# Patient Record
Sex: Female | Born: 1960 | Race: White | Hispanic: No | Smoking: Former smoker
Health system: Southern US, Community
[De-identification: ages and names within clinical notes are randomized; demographics above are authoritative.]

## PROBLEM LIST (undated history)

## (undated) DIAGNOSIS — Z9889 Other specified postprocedural states: Secondary | ICD-10-CM

## (undated) DIAGNOSIS — R112 Nausea with vomiting, unspecified: Secondary | ICD-10-CM

## (undated) DIAGNOSIS — D126 Benign neoplasm of colon, unspecified: Secondary | ICD-10-CM

## (undated) DIAGNOSIS — T4145XA Adverse effect of unspecified anesthetic, initial encounter: Secondary | ICD-10-CM

## (undated) DIAGNOSIS — D509 Iron deficiency anemia, unspecified: Secondary | ICD-10-CM

## (undated) DIAGNOSIS — N950 Postmenopausal bleeding: Secondary | ICD-10-CM

## (undated) DIAGNOSIS — Z9289 Personal history of other medical treatment: Secondary | ICD-10-CM

## (undated) DIAGNOSIS — Z86010 Personal history of colonic polyps: Secondary | ICD-10-CM

## (undated) DIAGNOSIS — Z860101 Personal history of adenomatous and serrated colon polyps: Secondary | ICD-10-CM

## (undated) DIAGNOSIS — N9489 Other specified conditions associated with female genital organs and menstrual cycle: Secondary | ICD-10-CM

## (undated) DIAGNOSIS — Z86718 Personal history of other venous thrombosis and embolism: Secondary | ICD-10-CM

## (undated) DIAGNOSIS — E039 Hypothyroidism, unspecified: Secondary | ICD-10-CM

## (undated) HISTORY — DX: Iron deficiency anemia, unspecified: D50.9

## (undated) HISTORY — PX: INCISION AND DRAINAGE: SHX5863

## (undated) HISTORY — PX: COLONOSCOPY W/ POLYPECTOMY: SHX1380

## (undated) HISTORY — DX: Benign neoplasm of colon, unspecified: D12.6

---

## 1999-08-03 DIAGNOSIS — T8859XA Other complications of anesthesia, initial encounter: Secondary | ICD-10-CM

## 1999-08-03 HISTORY — PX: SHOULDER ARTHROSCOPY: SHX128

## 1999-08-03 HISTORY — DX: Other complications of anesthesia, initial encounter: T88.59XA

## 2002-08-02 HISTORY — PX: ROUX-EN-Y GASTRIC BYPASS: SHX1104

## 2006-09-29 ENCOUNTER — Encounter: Payer: Self-pay | Admitting: Family Medicine

## 2006-09-29 ENCOUNTER — Other Ambulatory Visit: Admission: RE | Admit: 2006-09-29 | Discharge: 2006-09-29 | Payer: Self-pay | Admitting: Family Medicine

## 2006-09-29 ENCOUNTER — Ambulatory Visit: Payer: Self-pay | Admitting: Family Medicine

## 2007-02-16 ENCOUNTER — Encounter: Payer: Self-pay | Admitting: Family Medicine

## 2007-02-16 ENCOUNTER — Telehealth: Payer: Self-pay | Admitting: Family Medicine

## 2007-03-08 ENCOUNTER — Telehealth: Payer: Self-pay | Admitting: Family Medicine

## 2008-07-30 ENCOUNTER — Ambulatory Visit: Admission: RE | Admit: 2008-07-30 | Discharge: 2008-07-30 | Payer: Self-pay | Admitting: Emergency Medicine

## 2008-07-30 ENCOUNTER — Encounter (INDEPENDENT_AMBULATORY_CARE_PROVIDER_SITE_OTHER): Payer: Self-pay | Admitting: Emergency Medicine

## 2008-07-30 ENCOUNTER — Ambulatory Visit: Payer: Self-pay | Admitting: Surgery

## 2010-02-04 ENCOUNTER — Emergency Department (HOSPITAL_COMMUNITY): Admission: EM | Admit: 2010-02-04 | Discharge: 2010-02-04 | Payer: Self-pay | Admitting: Emergency Medicine

## 2010-10-18 LAB — GLUCOSE, CAPILLARY
Glucose-Capillary: 123 mg/dL — ABNORMAL HIGH (ref 70–99)
Glucose-Capillary: 36 mg/dL — CL (ref 70–99)

## 2010-10-18 LAB — POCT I-STAT, CHEM 8
Hemoglobin: 15.6 g/dL — ABNORMAL HIGH (ref 12.0–15.0)
Sodium: 142 mEq/L (ref 135–145)
TCO2: 21 mmol/L (ref 0–100)

## 2011-09-30 ENCOUNTER — Emergency Department (HOSPITAL_COMMUNITY)
Admission: EM | Admit: 2011-09-30 | Discharge: 2011-09-30 | Disposition: A | Discharge status: Home / Self Care | Attending: Emergency Medicine | Admitting: Emergency Medicine

## 2011-09-30 DIAGNOSIS — R259 Unspecified abnormal involuntary movements: Secondary | ICD-10-CM | POA: Insufficient documentation

## 2011-09-30 DIAGNOSIS — E162 Hypoglycemia, unspecified: Secondary | ICD-10-CM | POA: Insufficient documentation

## 2011-09-30 LAB — GLUCOSE, CAPILLARY: Glucose-Capillary: 227 mg/dL — ABNORMAL HIGH (ref 70–99)

## 2011-09-30 MED ORDER — DEXTROSE 50 % IV SOLN
50.0000 mL | Freq: Once | INTRAVENOUS | Status: AC
  Administered 2011-09-30: 50 mL via INTRAVENOUS

## 2011-09-30 MED ORDER — SODIUM CHLORIDE 0.9 % IV BOLUS (SEPSIS)
500.0000 mL | INTRAVENOUS | Status: AC
  Administered 2011-09-30: 500 mL via INTRAVENOUS

## 2011-09-30 MED ORDER — ONDANSETRON 4 MG PO TBDP
ORAL_TABLET | ORAL | Status: AC
  Administered 2011-09-30: 4 mg
  Filled 2011-09-30: qty 1

## 2011-09-30 NOTE — ED Provider Notes (Signed)
History     CSN: [id number]  Arrival date & time 09/30/11  1478   First MD Initiated Contact with Patient 09/30/11 [phone]      Chief Complaint  Patient presents with  . Hypoglycemia    (Consider location/radiation/quality/duration/timing/severity/associated sxs/prior treatment) HPI Comments: Very Pleasant 51 year old female who presents with a complaint of low blood sugar. 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  The history is provided by the patient.    No past medical history on file.  No past surgical history on file.  No family history on file.  History  Substance Use Topics  . Smoking status: Not on file  . Smokeless tobacco: Not on file  . Alcohol Use: Not on file    OB History    No data available      Review of Systems  Gastrointestinal: Negative for nausea and vomiting.  Neurological: Positive for tremors.    Allergies  Codeine and Penicillins  Home Medications   Current Outpatient Rx  Name Route Sig Dispense Refill  . ESTRADIOL 2 MG PO TABS Oral Take 2 mg by mouth daily.    Marland Kitchen LEVOTHYROXINE SODIUM 150 MCG PO TABS Oral Take 150 mcg by mouth daily.      BP 102/62  Pulse 61  Resp 20  SpO2 98%  Physical Exam  Constitutional: She appears well-developed and well-nourished.  Cardiovascular: Normal rate, regular rhythm, normal heart sounds and intact distal pulses.   Pulmonary/Chest: Effort normal and breath sounds normal.  Neurological:       Neurologic exam essentially normal with no signs of weakness, numbness, normal speech, normal strength, moves all extremities  Skin: Skin is warm and dry. No rash noted. She is not diaphoretic.    ED Course  Procedures (including critical care time)  Labs Reviewed  GLUCOSE, CAPILLARY - Abnormal; Notable for the following:    Glucose-Capillary 227 (*)    All other components within normal limits   No results found.   1.  Hypoglycemia       MDM  Initial blood sugar at 56, patient able to tolerate by mouth fluids, D50 given, appropriate increase in blood sugar, patient's symptoms have resolved and she is amenable to discharge        Vida Roller, MD 09/30/11 [phone]

## 2011-09-30 NOTE — ED Notes (Signed)
Pt given discharge instructions and verb understanding, amb indep to discharge window 

## 2011-09-30 NOTE — ED Notes (Signed)
Pt in with near syncopal episode, noted to be diaphoretic, CBG 56

## 2011-09-30 NOTE — ED Notes (Signed)
Bed:WA09<BR> Expected date:<BR> Expected time:<BR> Means of arrival:<BR> Comments:<BR>

## 2011-10-01 ENCOUNTER — Encounter (HOSPITAL_COMMUNITY): Payer: Self-pay

## 2012-08-02 HISTORY — PX: COMBINED REDUCTION MAMMAPLASTY W/ ABDOMINOPLASTY: SUR306

## 2013-09-06 ENCOUNTER — Encounter (HOSPITAL_COMMUNITY): Payer: Self-pay | Admitting: Emergency Medicine

## 2013-09-06 ENCOUNTER — Emergency Department (HOSPITAL_COMMUNITY)
Admission: EM | Admit: 2013-09-06 | Discharge: 2013-09-06 | Disposition: A | Discharge status: Home / Self Care | Source: Home / Self Care | Attending: Family Medicine | Admitting: Family Medicine

## 2013-09-06 ENCOUNTER — Emergency Department (INDEPENDENT_AMBULATORY_CARE_PROVIDER_SITE_OTHER)

## 2013-09-06 DIAGNOSIS — S20229A Contusion of unspecified back wall of thorax, initial encounter: Secondary | ICD-10-CM

## 2013-09-06 HISTORY — DX: Hypothyroidism, unspecified: E03.9

## 2013-09-06 NOTE — ED Provider Notes (Signed)
CSN: [id number]     Arrival date & time 09/06/13  1701 History   First MD Initiated Contact with Patient 09/06/13 1720     Chief Complaint  Patient presents with  . Back Pain   (Consider location/radiation/quality/duration/timing/severity/associated sxs/prior Treatment) HPI  53 year old F with back pain.   Back Pain: Location: thoracic spine Duration: 1 week Quality: 3/10 rest, 8/10 with activity Current Functional Status:  ADL's intact, but cannot lie flat due to pain Preceding Events:6 mile hike with her [employer] training use while carry 35lb pack, no direct trauma to the back Alleviating Factors: ibuprofen Exacerbating Factors: lying flat, cross fit Hx of intervention: no surgery, no PT Hx of imaging: no Red Flags: no weakness, no numbness and tingling, no impaired bowel or bladder function   Past Medical History  Diagnosis Date  . Hypothyroidism    Past Surgical History  Procedure Laterality Date  . Gastric bypass     No family history on file. History  Substance Use Topics  . Smoking status: Never Smoker   . Smokeless tobacco: Not on file  . Alcohol Use: No   OB History   Grav Para Term Preterm Abortions TAB SAB Ect Mult Living                 Review of Systems  Allergies  Codeine and Penicillins  Home Medications   Current Outpatient Rx  Name  Route  Sig  Dispense  Refill  . estradiol (ESTRACE) 2 MG tablet   Oral   Take 2 mg by mouth daily.         Marland Kitchen levothyroxine (SYNTHROID, LEVOTHROID) 150 MCG tablet   Oral   Take 150 mcg by mouth daily.          BP 116/79  Pulse 60  Temp(Src) 98 F (36.7 C) (Oral)  Resp 20  SpO2 100%  LMP 08/22/2013 Physical Exam Gen: well appearing middle age F, non distressed Back:  Appearance: sciolosis no Palpation: tenderness of paraspinal muscles no, spinous process no; pelvis no  Flexion: mildly limited in thoracic and lumbar spine Extension: normal  Neuro: Strength hip flexion 5/5, hip abduction 5/5, hip  adduction 5/5,  knee extension 5/5, knee flexion 5/5, dorsiflexion 5/5, plantar flexion 5/5 bilaterally Reflexes: patella 2/2 Bilateral  Achilles 2/2 Bilateral Straight Leg Raise: negative Sensation to light touch intact: yes     ED Course  Procedures (including critical care time) Labs Review Labs Reviewed - No data to display Imaging Review Dg Thoracic Spine 2 View  09/06/2013   CLINICAL DATA:  Back pain.  EXAM: THORACIC SPINE - 2 VIEW  COMPARISON:  None.  FINDINGS: There is no fracture or malalignment. Mild anterior endplate spurring in the mid and lower thoracic spine is noted. Paraspinous structures demonstrate suture material in the left upper quadrant of the abdomen.  IMPRESSION: No acute finding.  Mild appearing degenerative change.   Electronically Signed   By: Inge Rise M.D.   On: 09/06/2013 18:17      MDM   1. Contusion of mid back    No fracture on X-ray. No neuro signs or symptoms. Will treat symptoms with NSAIDS and f/u with PCP as needed.     Angelica Ran, MD 09/06/13 [phone]

## 2013-09-06 NOTE — Discharge Instructions (Signed)
It was great to meet you. I am very glad that there is no evidence of fracture on the X-ray. Please use the NSAIDS as needed and good luck with your PT test this weekend.   Sincerely,   Dr. Maricela Bo

## 2013-09-06 NOTE — ED Notes (Signed)
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

## 2013-09-07 NOTE — ED Provider Notes (Signed)
Medical screening examination/treatment/procedure(s) were performed by a resident physician or non-physician practitioner and as the supervising physician I was immediately available for consultation/collaboration.  Everardo Voris, MD    Raykwon Hobbs S Evadene Wardrip, MD 09/07/13 1149 

## 2013-10-15 ENCOUNTER — Encounter: Payer: Self-pay | Admitting: Internal Medicine

## 2013-10-17 ENCOUNTER — Encounter

## 2013-10-25 ENCOUNTER — Encounter

## 2013-10-25 ENCOUNTER — Ambulatory Visit (AMBULATORY_SURGERY_CENTER): Payer: Self-pay | Admitting: *Deleted

## 2013-10-25 VITALS — Ht 66.0 in | Wt 147.8 lb

## 2013-10-25 DIAGNOSIS — Z1211 Encounter for screening for malignant neoplasm of colon: Secondary | ICD-10-CM

## 2013-10-25 MED ORDER — MOVIPREP 100 G PO SOLR: 1.0000 | Freq: Once | ORAL | Status: DC

## 2013-10-25 NOTE — Progress Notes (Signed)
No egg or soy allergy. No anesthesia problems.  

## 2013-10-29 ENCOUNTER — Encounter: Admitting: Internal Medicine

## 2013-10-31 ENCOUNTER — Ambulatory Visit (AMBULATORY_SURGERY_CENTER): Admitting: Internal Medicine

## 2013-10-31 ENCOUNTER — Other Ambulatory Visit: Payer: Self-pay | Admitting: Internal Medicine

## 2013-10-31 ENCOUNTER — Encounter: Payer: Self-pay | Admitting: Internal Medicine

## 2013-10-31 VITALS — BP 108/76 | HR 49 | Temp 98.3°F | Resp 16 | Ht 66.0 in | Wt 147.0 lb

## 2013-10-31 DIAGNOSIS — D126 Benign neoplasm of colon, unspecified: Secondary | ICD-10-CM

## 2013-10-31 DIAGNOSIS — Z1211 Encounter for screening for malignant neoplasm of colon: Secondary | ICD-10-CM

## 2013-10-31 MED ORDER — SODIUM CHLORIDE 0.9 % IV SOLN: 500.0000 mL | INTRAVENOUS | Status: DC

## 2013-10-31 NOTE — Progress Notes (Signed)
Report to pacu rn, vss, bbs=clear 

## 2013-10-31 NOTE — Progress Notes (Signed)
Called to room to assist during endoscopic procedure.  Patient ID and intended procedure confirmed with present staff. Received instructions for my participation in the procedure from the performing physician.  

## 2013-10-31 NOTE — Op Note (Signed)
[facility]  [address]. [place], [address]   COLONOSCOPY PROCEDURE REPORT  PATIENT: Alexis Arnold, Alexis Arnold  MR#: [id number] BIRTHDATE: [birth year] , 41  yrs. old GENDER: Female ENDOSCOPIST: Jerene Bears, MD REFERRED [id number] Philip Aspen, M.D. PROCEDURE DATE:  10/31/2013 PROCEDURE:   Colonoscopy with snare polypectomy First Screening Colonoscopy - Avg.  risk and is 50 yrs.  old or older Yes.  Prior Negative Screening - Now for repeat screening. N/A  History of Adenoma - Now for follow-up colonoscopy & has been > or = to 3 yrs.  N/A  Polyps Removed Today? Yes. ASA CLASS:   Class II INDICATIONS:average risk screening and first colonoscopy. MEDICATIONS: MAC sedation, administered by CRNA and Propofol (Diprivan) 380 mg IV  DESCRIPTION OF PROCEDURE:   After the risks benefits and alternatives of the procedure were thoroughly explained, informed consent was obtained.  A digital rectal exam revealed no rectal mass.   The LB [id number]  endoscope was introduced through the anus and advanced to the terminal ileum which was intubated for a short distance. No adverse events experienced.   The quality of the prep was good, using MoviPrep  The instrument was then slowly withdrawn as the colon was fully examined.   COLON FINDINGS: The mucosa appeared normal in the terminal ileum. A sessile polyp measuring 7-8 mm in size was found in the transverse colon.  A polypectomy was performed using snare cautery. The resection was complete and the polyp tissue was completely retrieved.   The colon mucosa was otherwise normal.  Retroflexed views revealed no abnormalities. The time to cecum=5 minutes 15 seconds.  Withdrawal time=13 minutes 49 seconds.  The scope was withdrawn and the procedure completed. COMPLICATIONS: There were no complications.  ENDOSCOPIC IMPRESSION: 1.   Normal mucosa in the terminal ileum 2.   Sessile polyp measuring 7-8 mm in size was found in the transverse  colon; polypectomy was performed using snare cautery 3.   The colon mucosa was otherwise normal  RECOMMENDATIONS: 1.  Hold aspirin, aspirin products, and anti-inflammatory medication for 2 weeks. 2.  Await pathology results 3.  Timing of repeat colonoscopy will be determined by pathology findings. 4.  You will receive a letter within 1-2 weeks with the results of your biopsy as well as final recommendations.  Please call my office if you have not received a letter after 3 weeks.   eSigned:  Jerene Bears, MD 10/31/2013 4:35 PM cc: The Patient and Leanna Battles, MD

## 2013-10-31 NOTE — Patient Instructions (Signed)

## 2013-11-01 ENCOUNTER — Telehealth: Payer: Self-pay | Admitting: *Deleted

## 2013-11-01 NOTE — Telephone Encounter (Signed)
Left message that we called for f/u 

## 2013-11-07 ENCOUNTER — Encounter (HOSPITAL_COMMUNITY): Payer: Self-pay

## 2013-11-07 ENCOUNTER — Encounter (HOSPITAL_COMMUNITY)
Admission: RE | Admit: 2013-11-07 | Discharge: 2013-11-07 | Disposition: A | Discharge status: Home / Self Care | Source: Ambulatory Visit | Attending: Internal Medicine | Admitting: Internal Medicine

## 2013-11-07 ENCOUNTER — Other Ambulatory Visit (HOSPITAL_COMMUNITY): Payer: Self-pay | Admitting: Internal Medicine

## 2013-11-07 DIAGNOSIS — D649 Anemia, unspecified: Secondary | ICD-10-CM | POA: Insufficient documentation

## 2013-11-07 MED ORDER — FERUMOXYTOL INJECTION 510 MG/17 ML
510.0000 mg | Freq: Once | INTRAVENOUS | Status: AC
  Administered 2013-11-07: 510 mg via INTRAVENOUS
  Filled 2013-11-07: qty 17

## 2013-11-07 MED ORDER — FERUMOXYTOL INJECTION 510 MG/17 ML: 510.0000 mg | Freq: Once | INTRAVENOUS | Status: DC

## 2013-11-07 MED ORDER — SODIUM CHLORIDE 0.9 % IV SOLN
Freq: Once | INTRAVENOUS | Status: AC
  Administered 2013-11-07: 14:00:00 via INTRAVENOUS

## 2013-11-07 NOTE — Discharge Instructions (Signed)

## 2013-11-11 ENCOUNTER — Encounter: Payer: Self-pay | Admitting: Internal Medicine

## 2013-11-14 ENCOUNTER — Encounter (HOSPITAL_COMMUNITY): Payer: Self-pay

## 2013-11-14 ENCOUNTER — Encounter (HOSPITAL_COMMUNITY)
Admission: RE | Admit: 2013-11-14 | Discharge: 2013-11-14 | Disposition: A | Discharge status: Home / Self Care | Source: Ambulatory Visit | Attending: Internal Medicine | Admitting: Internal Medicine

## 2013-11-14 MED ORDER — FERUMOXYTOL INJECTION 510 MG/17 ML
510.0000 mg | Freq: Once | INTRAVENOUS | Status: AC
  Administered 2013-11-14: 510 mg via INTRAVENOUS
  Filled 2013-11-14: qty 17

## 2013-11-14 MED ORDER — FERUMOXYTOL INJECTION 510 MG/17 ML: 510.0000 mg | Freq: Once | INTRAVENOUS | Status: DC

## 2013-11-14 MED ORDER — SODIUM CHLORIDE 0.9 % IV SOLN
Freq: Once | INTRAVENOUS | Status: AC
  Administered 2013-11-14: 14:00:00 via INTRAVENOUS

## 2013-11-14 NOTE — Discharge Instructions (Signed)

## 2014-03-07 ENCOUNTER — Inpatient Hospital Stay (HOSPITAL_COMMUNITY)
Admission: EM | Admit: 2014-03-07 | Discharge: 2014-03-09 | DRG: 989 | Disposition: A | Discharge status: Home / Self Care | Attending: Orthopaedic Surgery | Admitting: Orthopaedic Surgery

## 2014-03-07 ENCOUNTER — Encounter (HOSPITAL_COMMUNITY): Payer: Self-pay | Admitting: Emergency Medicine

## 2014-03-07 DIAGNOSIS — Z87891 Personal history of nicotine dependence: Secondary | ICD-10-CM

## 2014-03-07 DIAGNOSIS — M65849 Other synovitis and tenosynovitis, unspecified hand: Secondary | ICD-10-CM | POA: Diagnosis present

## 2014-03-07 DIAGNOSIS — Z86718 Personal history of other venous thrombosis and embolism: Secondary | ICD-10-CM

## 2014-03-07 DIAGNOSIS — E039 Hypothyroidism, unspecified: Secondary | ICD-10-CM | POA: Diagnosis present

## 2014-03-07 DIAGNOSIS — Z9884 Bariatric surgery status: Secondary | ICD-10-CM | POA: Diagnosis not present

## 2014-03-07 DIAGNOSIS — W5501XA Bitten by cat, initial encounter: Secondary | ICD-10-CM

## 2014-03-07 DIAGNOSIS — IMO0002 Reserved for concepts with insufficient information to code with codable children: Secondary | ICD-10-CM | POA: Diagnosis not present

## 2014-03-07 DIAGNOSIS — L03113 Cellulitis of right upper limb: Secondary | ICD-10-CM

## 2014-03-07 DIAGNOSIS — M65839 Other synovitis and tenosynovitis, unspecified forearm: Secondary | ICD-10-CM | POA: Diagnosis present

## 2014-03-07 DIAGNOSIS — IMO0001 Reserved for inherently not codable concepts without codable children: Secondary | ICD-10-CM | POA: Diagnosis present

## 2014-03-07 LAB — CBC WITH DIFFERENTIAL/PLATELET
Basophils Absolute: 0 10*3/uL (ref 0.0–0.1)
Basophils Relative: 0 % (ref 0–1)
EOS PCT: 1 % (ref 0–5)
Eosinophils Absolute: 0.1 10*3/uL (ref 0.0–0.7)
HCT: 40 % (ref 36.0–46.0)
HEMOGLOBIN: 13 g/dL (ref 12.0–15.0)
LYMPHS ABS: 2.4 10*3/uL (ref 0.7–4.0)
LYMPHS PCT: 20 % (ref 12–46)
MCH: 30.8 pg (ref 26.0–34.0)
MCHC: 32.5 g/dL (ref 30.0–36.0)
MCV: 94.8 fL (ref 78.0–100.0)
MONO ABS: 1 10*3/uL (ref 0.1–1.0)
MONOS PCT: 8 % (ref 3–12)
NEUTROS ABS: 8.5 10*3/uL — AB (ref 1.7–7.7)
Neutrophils Relative %: 71 % (ref 43–77)
Platelets: 333 10*3/uL (ref 150–400)
RBC: 4.22 MIL/uL (ref 3.87–5.11)
RDW: 13 % (ref 11.5–15.5)
WBC: 12 10*3/uL — AB (ref 4.0–10.5)

## 2014-03-07 LAB — BASIC METABOLIC PANEL
Anion gap: 9 (ref 5–15)
BUN: 16 mg/dL (ref 6–23)
CALCIUM: 9.1 mg/dL (ref 8.4–10.5)
CHLORIDE: 103 meq/L (ref 96–112)
CO2: 26 meq/L (ref 19–32)
CREATININE: 1.16 mg/dL — AB (ref 0.50–1.10)
GFR calc Af Amer: 61 mL/min — ABNORMAL LOW (ref >90)
GFR calc non Af Amer: 53 mL/min — ABNORMAL LOW (ref >90)
GLUCOSE: 89 mg/dL (ref 70–99)
Potassium: 4.1 mEq/L (ref 3.7–5.3)
Sodium: 138 mEq/L (ref 137–147)

## 2014-03-07 MED ORDER — SODIUM CHLORIDE 0.9 % IV BOLUS (SEPSIS)
1000.0000 mL | Freq: Once | INTRAVENOUS | Status: AC
  Administered 2014-03-07: 1000 mL via INTRAVENOUS

## 2014-03-07 MED ORDER — SODIUM CHLORIDE 0.9 % IV SOLN
3.0000 g | Freq: Once | INTRAVENOUS | Status: AC
  Administered 2014-03-07: 3 g via INTRAVENOUS
  Filled 2014-03-07: qty 3

## 2014-03-07 NOTE — ED Provider Notes (Signed)
CSN: [id number]     Arrival date & time 03/07/14  2138 History  This chart was scribed for non-physician practitioner, Margarita Mail, PA-C,working with Deliah Goody , by Marlowe Kays, ED Scribe. This patient was seen in room WTR7/WTR7 and the patient's care was started at 9:58 PM.  Chief Complaint  Patient presents with  . Animal Bite   The history is provided by the patient. No language interpreter was used.   HPI Comments:  Alexis Arnold is a 53 y.o. female who presents to the Emergency Department complaining of a cat bite to the dorsal aspect of her right arm that occurred five days ago. Pt states it was her house cat that bit her and the cat is UTD on all vaccinations. She reports being treated with Augmentin and has been taking as prescribed for the past four days. She states she cannot dorsiflex and has limited ROM of the wrist due to pain. She endorses taking Ibuprofen for the pain. She reports associated worsening redness, soreness, and warmth to touch. She denies any fever, chills, numbness or tingling of the arm. She states she is right-hand dominant. Pt's tetanus vaccination is UTD.  Past Medical History  Diagnosis Date  . Hypothyroidism   . Anemia    Past Surgical History  Procedure Laterality Date  . Gastric bypass    . Cesarean section  1990  . Shoulder arthroscopy Right 2001  . Roux-en-y procedure  2004  . Combined mammaplasty revision w/ abdominoplasty  2014   Family History  Problem Relation Age of Onset  . Colon cancer Neg Hx    History  Substance Use Topics  . Smoking status: Former Research scientist (life sciences)  . Smokeless tobacco: Never Used     Comment: smoked for 14 years  . Alcohol Use: Yes     Comment: occasionally   OB History   Grav Para Term Preterm Abortions TAB SAB Ect Mult Living                 Review of Systems  Constitutional: Negative for fever and chills.  Skin: Positive for color change and wound.  Neurological: Negative for numbness.  All other  systems reviewed and are negative.   Allergies  Codeine and Penicillins  Home Medications   Prior to Admission medications   Medication Sig Start Date End Date Taking? Authorizing Provider  amoxicillin-clavulanate (AUGMENTIN) 875-125 MG per tablet Take 1 tablet by mouth 2 (two) times daily.   Yes Historical Provider, MD  cyanocobalamin (,VITAMIN B-12,) 1000 MCG/ML injection Inject 1,000 mcg into the muscle every 30 (thirty) days.    Yes Historical Provider, MD  estrogens, conjugated, (PREMARIN) 1.25 MG tablet Take 1.25 mg by mouth daily.   Yes Historical Provider, MD  ibuprofen (ADVIL,MOTRIN) 200 MG tablet Take 800 mg by mouth every 6 (six) hours as needed for moderate pain.    Yes Historical Provider, MD  levothyroxine (SYNTHROID, LEVOTHROID) 150 MCG tablet Take 150 mcg by mouth daily.   Yes Historical Provider, MD  Multiple Vitamin (MULTIVITAMIN) tablet Take 1 tablet by mouth daily.   Yes Historical Provider, MD   Triage Vitals: BP 123/67  Pulse 86  Temp(Src) 98.1 F (36.7 C) (Oral)  Resp 16  SpO2 100%  LMP 03/05/2014 Physical Exam  Nursing note and vitals reviewed. Constitutional: She is oriented to person, place, and time. She appears well-developed and well-nourished. No distress.  HENT:  Head: Normocephalic and atraumatic.  Eyes: Conjunctivae and EOM are normal. No scleral icterus.  Neck:  Normal range of motion.  Cardiovascular: Normal rate, regular rhythm and normal heart sounds.  Exam reveals no gallop and no friction rub.   No murmur heard. Pulmonary/Chest: Effort normal and breath sounds normal. No respiratory distress.  Abdominal: Soft. Bowel sounds are normal. She exhibits no distension and no mass. There is no tenderness. There is no guarding.  Musculoskeletal: Normal range of motion.       Arms: 4x6 cm are of heat, redness, with scabbing, tight, shiny skin. TTP.  Able to make a fist. Unable to actively extend the the r forearm.  Neurological: She is alert and  oriented to person, place, and time.  Skin: Skin is warm and dry. She is not diaphoretic.  Psychiatric: She has a normal mood and affect. Her behavior is normal.    ED Course  Procedures (including critical care time) DIAGNOSTIC STUDIES: Oxygen Saturation is 100% on RA, normal by my interpretation.   COORDINATION OF CARE: 10:06 PM- Will administer IV antibiotics. Pt verbalizes understanding and agrees to plan.  Medications  Ampicillin-Sulbactam (UNASYN) 3 g in sodium chloride 0.9 % 100 mL IVPB (3 g Intravenous New Bag/Given 03/07/14 2224)  sodium chloride 0.9 % bolus 1,000 mL (1,000 mLs Intravenous New Bag/Given 03/07/14 2224)    Labs Review Labs Reviewed  CBC WITH DIFFERENTIAL - Abnormal; Notable for the following:    WBC 12.0 (*)    Neutro Abs 8.5 (*)    All other components within normal limits  BASIC METABOLIC PANEL - Abnormal; Notable for the following:    Creatinine, Ser 1.16 (*)    GFR calc non Af Amer 53 (*)    GFR calc Af Amer 61 (*)    All other components within normal limits    Imaging Review No results found.   EKG Interpretation None      MDM   Final diagnoses:  None    Patient with worsening cellulitis of the R forearm after cat bite and failure on OP augmentin.  I have ordered IV Unasyn. Dr. Ninfa Linden on call for hand will consult. Dr. Virgina Jock on call for [facility] medical will admit.  Patient seen in shared visit with Dr. Dina Rich.  + leukocytosis, left shift. Elevated creatinine/  I personally performed the services described in this documentation, which was scribed in my presence. The recorded information has been reviewed and is accurate.    Margarita Mail, PA-C 03/08/14 1806

## 2014-03-07 NOTE — ED Notes (Signed)
Pt was bit by her house cat, cat has all his immunizations. Pt has been on augmentin since Monday and her right forearm remains swollen and red.

## 2014-03-08 ENCOUNTER — Encounter (HOSPITAL_COMMUNITY): Admitting: Certified Registered Nurse Anesthetist

## 2014-03-08 ENCOUNTER — Encounter (HOSPITAL_COMMUNITY): Payer: Self-pay | Admitting: General Practice

## 2014-03-08 ENCOUNTER — Inpatient Hospital Stay (HOSPITAL_COMMUNITY): Admitting: Certified Registered Nurse Anesthetist

## 2014-03-08 ENCOUNTER — Encounter (HOSPITAL_COMMUNITY)
Admission: EM | Disposition: A | Discharge status: Home / Self Care | Payer: Self-pay | Source: Home / Self Care | Attending: Orthopaedic Surgery

## 2014-03-08 DIAGNOSIS — Z9884 Bariatric surgery status: Secondary | ICD-10-CM | POA: Diagnosis not present

## 2014-03-08 DIAGNOSIS — E039 Hypothyroidism, unspecified: Secondary | ICD-10-CM | POA: Diagnosis present

## 2014-03-08 DIAGNOSIS — Z86718 Personal history of other venous thrombosis and embolism: Secondary | ICD-10-CM | POA: Diagnosis not present

## 2014-03-08 DIAGNOSIS — IMO0001 Reserved for inherently not codable concepts without codable children: Secondary | ICD-10-CM | POA: Diagnosis present

## 2014-03-08 DIAGNOSIS — IMO0002 Reserved for concepts with insufficient information to code with codable children: Secondary | ICD-10-CM | POA: Diagnosis present

## 2014-03-08 DIAGNOSIS — M65839 Other synovitis and tenosynovitis, unspecified forearm: Secondary | ICD-10-CM | POA: Diagnosis present

## 2014-03-08 DIAGNOSIS — Z87891 Personal history of nicotine dependence: Secondary | ICD-10-CM | POA: Diagnosis not present

## 2014-03-08 DIAGNOSIS — L03113 Cellulitis of right upper limb: Secondary | ICD-10-CM

## 2014-03-08 DIAGNOSIS — W5501XA Bitten by cat, initial encounter: Secondary | ICD-10-CM

## 2014-03-08 HISTORY — PX: I&D EXTREMITY: SHX5045

## 2014-03-08 LAB — CBC
HCT: 35.6 % — ABNORMAL LOW (ref 36.0–46.0)
HEMOGLOBIN: 11.4 g/dL — AB (ref 12.0–15.0)
MCH: 30.6 pg (ref 26.0–34.0)
MCHC: 32 g/dL (ref 30.0–36.0)
MCV: 95.7 fL (ref 78.0–100.0)
Platelets: 272 10*3/uL (ref 150–400)
RBC: 3.72 MIL/uL — ABNORMAL LOW (ref 3.87–5.11)
RDW: 13 % (ref 11.5–15.5)
WBC: 10.7 10*3/uL — ABNORMAL HIGH (ref 4.0–10.5)

## 2014-03-08 LAB — BASIC METABOLIC PANEL
ANION GAP: 10 (ref 5–15)
BUN: 15 mg/dL (ref 6–23)
CO2: 23 meq/L (ref 19–32)
Calcium: 8.4 mg/dL (ref 8.4–10.5)
Chloride: 105 mEq/L (ref 96–112)
Creatinine, Ser: 0.82 mg/dL (ref 0.50–1.10)
GFR calc Af Amer: >90 mL/min (ref >90)
GFR calc non Af Amer: 80 mL/min — ABNORMAL LOW (ref >90)
GLUCOSE: 92 mg/dL (ref 70–99)
Potassium: 3.6 mEq/L — ABNORMAL LOW (ref 3.7–5.3)
Sodium: 138 mEq/L (ref 137–147)

## 2014-03-08 LAB — SURGICAL PCR SCREEN
MRSA, PCR: NEGATIVE
STAPHYLOCOCCUS AUREUS: NEGATIVE

## 2014-03-08 SURGERY — MINOR IRRIGATION AND DEBRIDEMENT EXTREMITY
Anesthesia: General | Site: Arm Lower | Laterality: Right

## 2014-03-08 MED ORDER — ESTROGENS CONJUGATED 1.25 MG PO TABS
1.2500 mg | ORAL_TABLET | Freq: Every day | ORAL | Status: DC
  Administered 2014-03-08 – 2014-03-09 (×2): 1.25 mg via ORAL
  Filled 2014-03-08 (×2): qty 1

## 2014-03-08 MED ORDER — LIDOCAINE HCL (CARDIAC) 20 MG/ML IV SOLN
INTRAVENOUS | Status: AC
  Filled 2014-03-08: qty 5

## 2014-03-08 MED ORDER — SODIUM CHLORIDE 0.9 % IV SOLN
INTRAVENOUS | Status: AC
  Filled 2014-03-08: qty 3

## 2014-03-08 MED ORDER — LACTATED RINGERS IV SOLN
INTRAVENOUS | Status: DC | PRN
  Administered 2014-03-08: 17:00:00 via INTRAVENOUS

## 2014-03-08 MED ORDER — MIDAZOLAM HCL 5 MG/5ML IJ SOLN
INTRAMUSCULAR | Status: DC | PRN
  Administered 2014-03-08: 2 mg via INTRAVENOUS

## 2014-03-08 MED ORDER — 0.9 % SODIUM CHLORIDE (POUR BTL) OPTIME
TOPICAL | Status: DC | PRN
  Administered 2014-03-08: 1000 mL

## 2014-03-08 MED ORDER — TRAMADOL HCL 50 MG PO TABS
50.0000 mg | ORAL_TABLET | Freq: Four times a day (QID) | ORAL | Status: DC | PRN
  Administered 2014-03-08 (×3): 50 mg via ORAL
  Filled 2014-03-08 (×3): qty 1

## 2014-03-08 MED ORDER — ONDANSETRON HCL 4 MG/2ML IJ SOLN
INTRAMUSCULAR | Status: AC
Start: 2014-03-08 — End: 2014-03-08
  Filled 2014-03-08: qty 2

## 2014-03-08 MED ORDER — CLINDAMYCIN PHOSPHATE 600 MG/50ML IV SOLN
600.0000 mg | Freq: Once | INTRAVENOUS | Status: AC
  Administered 2014-03-08: 600 mg via INTRAVENOUS
  Filled 2014-03-08: qty 50

## 2014-03-08 MED ORDER — HYDROMORPHONE HCL PF 1 MG/ML IJ SOLN
0.2500 mg | INTRAMUSCULAR | Status: DC | PRN
  Administered 2014-03-08: 0.5 mg via INTRAVENOUS

## 2014-03-08 MED ORDER — ACETAMINOPHEN 650 MG RE SUPP: 650.0000 mg | Freq: Four times a day (QID) | RECTAL | Status: DC | PRN

## 2014-03-08 MED ORDER — IBUPROFEN 800 MG PO TABS: 800.0000 mg | ORAL_TABLET | Freq: Four times a day (QID) | ORAL | Status: DC | PRN

## 2014-03-08 MED ORDER — DEXAMETHASONE SODIUM PHOSPHATE 10 MG/ML IJ SOLN
INTRAMUSCULAR | Status: AC
  Filled 2014-03-08: qty 1

## 2014-03-08 MED ORDER — HYDROMORPHONE HCL PF 1 MG/ML IJ SOLN
INTRAMUSCULAR | Status: AC
  Filled 2014-03-08: qty 1

## 2014-03-08 MED ORDER — PROMETHAZINE HCL 25 MG/ML IJ SOLN: 6.2500 mg | INTRAMUSCULAR | Status: DC | PRN

## 2014-03-08 MED ORDER — ONDANSETRON HCL 4 MG/2ML IJ SOLN
INTRAMUSCULAR | Status: DC | PRN
  Administered 2014-03-08: 4 mg via INTRAVENOUS

## 2014-03-08 MED ORDER — FENTANYL CITRATE 0.05 MG/ML IJ SOLN
INTRAMUSCULAR | Status: DC | PRN
  Administered 2014-03-08: 50 ug via INTRAVENOUS
  Administered 2014-03-08 (×2): 25 ug via INTRAVENOUS

## 2014-03-08 MED ORDER — LEVOTHYROXINE SODIUM 150 MCG PO TABS
150.0000 ug | ORAL_TABLET | Freq: Every day | ORAL | Status: DC
  Administered 2014-03-08 – 2014-03-09 (×2): 150 ug via ORAL
  Filled 2014-03-08 (×4): qty 1

## 2014-03-08 MED ORDER — SODIUM CHLORIDE 0.9 % IV SOLN
INTRAVENOUS | Status: DC
  Administered 2014-03-08 (×2): via INTRAVENOUS

## 2014-03-08 MED ORDER — PROPOFOL 10 MG/ML IV BOLUS
INTRAVENOUS | Status: AC
  Filled 2014-03-08: qty 20

## 2014-03-08 MED ORDER — HYDROMORPHONE HCL PF 1 MG/ML IJ SOLN
0.5000 mg | INTRAMUSCULAR | Status: DC | PRN
  Filled 2014-03-08: qty 1

## 2014-03-08 MED ORDER — DEXAMETHASONE SODIUM PHOSPHATE 10 MG/ML IJ SOLN
INTRAMUSCULAR | Status: DC | PRN
Start: 2014-03-08 — End: 2014-03-08
  Administered 2014-03-08: 10 mg via INTRAVENOUS

## 2014-03-08 MED ORDER — ENOXAPARIN SODIUM 40 MG/0.4ML ~~LOC~~ SOLN
40.0000 mg | SUBCUTANEOUS | Status: DC
  Filled 2014-03-08: qty 0.4

## 2014-03-08 MED ORDER — OXYCODONE HCL 5 MG PO TABS: 5.0000 mg | ORAL_TABLET | ORAL | Status: DC | PRN

## 2014-03-08 MED ORDER — ACETAMINOPHEN 325 MG PO TABS: 650.0000 mg | ORAL_TABLET | Freq: Four times a day (QID) | ORAL | Status: DC | PRN

## 2014-03-08 MED ORDER — RISAQUAD PO CAPS
1.0000 | ORAL_CAPSULE | Freq: Every day | ORAL | Status: DC
  Administered 2014-03-08 – 2014-03-09 (×2): 1 via ORAL
  Filled 2014-03-08 (×2): qty 1

## 2014-03-08 MED ORDER — ADULT MULTIVITAMIN W/MINERALS CH
1.0000 | ORAL_TABLET | Freq: Every day | ORAL | Status: DC
  Administered 2014-03-08 – 2014-03-09 (×2): 1 via ORAL
  Filled 2014-03-08 (×2): qty 1

## 2014-03-08 MED ORDER — ONDANSETRON HCL 4 MG/2ML IJ SOLN: 4.0000 mg | Freq: Four times a day (QID) | INTRAMUSCULAR | Status: DC | PRN

## 2014-03-08 MED ORDER — POLYMYXIN B SULFATE 500000 UNITS IJ SOLR
INTRAMUSCULAR | Status: DC | PRN
  Administered 2014-03-08: 18:00:00

## 2014-03-08 MED ORDER — AMPICILLIN-SULBACTAM SODIUM 3 (2-1) G IJ SOLR
3.0000 g | Freq: Four times a day (QID) | INTRAMUSCULAR | Status: DC
  Administered 2014-03-08 – 2014-03-09 (×5): 3 g via INTRAVENOUS
  Filled 2014-03-08 (×7): qty 3

## 2014-03-08 MED ORDER — ONDANSETRON HCL 4 MG PO TABS: 4.0000 mg | ORAL_TABLET | Freq: Four times a day (QID) | ORAL | Status: DC | PRN

## 2014-03-08 MED ORDER — PROPOFOL 10 MG/ML IV BOLUS
INTRAVENOUS | Status: DC | PRN
  Administered 2014-03-08: 200 mg via INTRAVENOUS

## 2014-03-08 MED ORDER — FENTANYL CITRATE 0.05 MG/ML IJ SOLN
INTRAMUSCULAR | Status: AC
  Filled 2014-03-08: qty 2

## 2014-03-08 MED ORDER — LIDOCAINE HCL (CARDIAC) 20 MG/ML IV SOLN
INTRAVENOUS | Status: DC | PRN
  Administered 2014-03-08: 50 mg via INTRAVENOUS

## 2014-03-08 MED ORDER — METOCLOPRAMIDE HCL 5 MG/ML IJ SOLN
INTRAMUSCULAR | Status: DC | PRN
  Administered 2014-03-08: 10 mg via INTRAVENOUS

## 2014-03-08 MED ORDER — FENTANYL CITRATE 0.05 MG/ML IJ SOLN: 25.0000 ug | INTRAMUSCULAR | Status: DC | PRN

## 2014-03-08 MED ORDER — DIPHENHYDRAMINE HCL 50 MG/ML IJ SOLN: 25.0000 mg | Freq: Four times a day (QID) | INTRAMUSCULAR | Status: DC | PRN

## 2014-03-08 MED ORDER — METOCLOPRAMIDE HCL 5 MG/ML IJ SOLN
INTRAMUSCULAR | Status: AC
  Filled 2014-03-08: qty 2

## 2014-03-08 MED ORDER — MIDAZOLAM HCL 2 MG/2ML IJ SOLN
INTRAMUSCULAR | Status: AC
Start: 2014-03-08 — End: 2014-03-08
  Filled 2014-03-08: qty 2

## 2014-03-08 SURGICAL SUPPLY — 33 items
BAG ZIPLOCK 12X15 (MISCELLANEOUS) ×3 IMPLANT
BANDAGE ELASTIC 6 VELCRO ST LF (GAUZE/BANDAGES/DRESSINGS) ×3 IMPLANT
BANDAGE ESMARK 6X9 LF (GAUZE/BANDAGES/DRESSINGS) ×1 IMPLANT
BNDG ESMARK 6X9 LF (GAUZE/BANDAGES/DRESSINGS) ×3
BNDG GAUZE ELAST 4 BULKY (GAUZE/BANDAGES/DRESSINGS) ×3 IMPLANT
CUFF TOURN SGL QUICK 18 (TOURNIQUET CUFF) ×3 IMPLANT
CUFF TOURN SGL QUICK 24 (TOURNIQUET CUFF)
CUFF TOURN SGL QUICK 34 (TOURNIQUET CUFF)
CUFF TRNQT CYL 24X4X40X1 (TOURNIQUET CUFF) IMPLANT
CUFF TRNQT CYL 34X4X40X1 (TOURNIQUET CUFF) IMPLANT
DRAIN PENROSE 18X1/2 LTX STRL (DRAIN) ×3 IMPLANT
DRSG PAD ABDOMINAL 8X10 ST (GAUZE/BANDAGES/DRESSINGS) ×6 IMPLANT
DURAPREP 26ML APPLICATOR (WOUND CARE) ×3 IMPLANT
ELECT REM PT RETURN 9FT ADLT (ELECTROSURGICAL) ×3
ELECTRODE REM PT RTRN 9FT ADLT (ELECTROSURGICAL) ×1 IMPLANT
GAUZE SPONGE 4X4 12PLY STRL (GAUZE/BANDAGES/DRESSINGS) ×3 IMPLANT
GAUZE XEROFORM 1X8 LF (GAUZE/BANDAGES/DRESSINGS) ×3 IMPLANT
GLOVE BIO SURGEON STRL SZ7.5 (GLOVE) ×3 IMPLANT
GLOVE BIOGEL PI IND STRL 8 (GLOVE) ×1 IMPLANT
GLOVE BIOGEL PI INDICATOR 8 (GLOVE) ×2
GLOVE ECLIPSE 8.0 STRL XLNG CF (GLOVE) ×3 IMPLANT
GOWN STRL REUS W/TWL XL LVL3 (GOWN DISPOSABLE) ×3 IMPLANT
HANDPIECE INTERPULSE COAX TIP (DISPOSABLE)
KIT BASIN OR (CUSTOM PROCEDURE TRAY) ×3 IMPLANT
PACK LOWER EXTREMITY WL (CUSTOM PROCEDURE TRAY) ×3 IMPLANT
PAD ABD 8X10 STRL (GAUZE/BANDAGES/DRESSINGS) ×3 IMPLANT
PAD CAST 4YDX4 CTTN HI CHSV (CAST SUPPLIES) ×1 IMPLANT
PADDING CAST COTTON 4X4 STRL (CAST SUPPLIES) ×2
POSITIONER SURGICAL ARM (MISCELLANEOUS) ×3 IMPLANT
SCOTCHCAST PLUS 4X4 WHITE (CAST SUPPLIES) ×3 IMPLANT
SET HNDPC FAN SPRY TIP SCT (DISPOSABLE) IMPLANT
SYR CONTROL 10ML LL (SYRINGE) ×3 IMPLANT
TOWEL OR 17X26 10 PK STRL BLUE (TOWEL DISPOSABLE) ×6 IMPLANT

## 2014-03-08 NOTE — Progress Notes (Signed)
Subjective: Admitted this am c R forearm Cellulitis d/t Cat bite after failing Outpt Augmentin.  Took IV Unasyn and one dose IV clinda last night Feels a little better No new complaints  Objective: Vital signs in last 24 hours: Temp:  [97.4 F (36.3 C)-98.1 F (36.7 C)] 97.4 F (36.3 C) (08/07 0635) Pulse Rate:  [57-86] 57 (08/07 0635) Resp:  [16-18] 18 (08/07 0635) BP: (99-141)/(67-78) 99/68 mmHg (08/07 0635) SpO2:  [99 %-100 %] 100 % (08/07 0635) Weight:  [67.994 kg (149 lb 14.4 oz)] 67.994 kg (149 lb 14.4 oz) (08/07 0207) Weight change:  Last BM Date: 03/07/14  CBG (last 3)  No results found for this basename: GLUCAP,  in the last 72 hours  Intake/Output from previous day:  Intake/Output Summary (Last 24 hours) at 03/08/14 0736 Last data filed at 03/08/14 0650  Gross per 24 hour  Intake    109 ml  Output    450 ml  Net   -341 ml   08/06 0701 - 08/07 0700 In: 109 [I.V.:109] Out: 450 [Urine:450]   Physical Exam General appearance: A and O  Resp: CTA  Cardio: Reg  GI: soft, non-tender; bowel sounds normal; no masses, no organomegaly  Extremities: UE r forearm c Cellulitis, warmth, redness, swelling and tenderness. Cat teeth puncture wound noted. No pus. @ 4-5 cm of cellulitis and looks a little better than last night. Does not extend into hand. The pain c hand and wrist movement is because of swelling at tendon site. No Hand compartment syndrome Pulses: 2+ and symmetric  Lymph nodes: no cervical lymphadenopathy  Neurologic: Alert and oriented X 3, normal strength and tone. Normal symmetric reflexes.       Lab Results:  Recent Labs  03/07/14 2222 03/08/14 0500  NA 138 138  K 4.1 3.6*  CL 103 105  CO2 26 23  GLUCOSE 89 92  BUN 16 15  CREATININE 1.16* 0.82  CALCIUM 9.1 8.4    No results found for this basename: AST, ALT, ALKPHOS, BILITOT, PROT, ALBUMIN,  in the last 72 hours   Recent Labs  03/07/14 2226 03/08/14 0500  WBC 12.0* 10.7*  NEUTROABS  8.5*  --   HGB 13.0 11.4*  HCT 40.0 35.6*  MCV 94.8 95.7  PLT 333 272    No results found for this basename: INR, PROTIME    No results found for this basename: CKTOTAL, CKMB, CKMBINDEX, TROPONINI,  in the last 72 hours  No results found for this basename: TSH, T4TOTAL, FREET3, T3FREE, THYROIDAB,  in the last 72 hours  No results found for this basename: VITAMINB12, FOLATE, FERRITIN, TIBC, IRON, RETICCTPCT,  in the last 72 hours  Micro Results: No results found for this or any previous visit (from the past 240 hour(s)).   Studies/Results: No results found.   Medications: Scheduled: . acidophilus  1 capsule Oral Daily  . ampicillin-sulbactam (UNASYN) IV  3 g Intravenous Q6H  . enoxaparin (LOVENOX) injection  40 mg Subcutaneous Q24H  . estrogens (conjugated)  1.25 mg Oral Daily  . levothyroxine  150 mcg Oral QAC breakfast  . multivitamin with minerals  1 tablet Oral Daily   Continuous: . sodium chloride 30 mL/hr at 03/08/14 0600     Assessment/Plan: Active Problems:   Cellulitis of forearm, right   Cat bite   Hypothyroid   R forearm Cellulitis d/t Cat bite which has failed Outpt Augmentin. Mild Leukocytosis already is resolving. Continue low dose IVF. Pain Control c Ibuprofen/Tramadol/Tylenol. Unasyn (  She tolerates Augmentin and Unasyn despite PCN Allergy and is comfortable continuing?). S/P one dose Clinda - give one more. Dr Ninfa Linden to see soon - Doubt surgical intervention needed. DVT prophylaxis. Home meds. Probiotics. Anticipate 24-48 hrs of IV Abx then transition back to oral and go home.  S/P Gastric Bypass 2004 and Nml Weight  ID -  Anti-infectives   Start     Dose/Rate Route Frequency Ordered Stop   03/08/14 0600  Ampicillin-Sulbactam (UNASYN) 3 g in sodium chloride 0.9 % 100 mL IVPB     3 g 100 mL/hr over 60 Minutes Intravenous Every 6 hours 03/08/14 0208     03/08/14 0215  clindamycin (CLEOCIN) IVPB 600 mg     600 mg 100 mL/hr over 30 Minutes  Intravenous  Once 03/08/14 0208 03/08/14 0302   03/07/14 2215  Ampicillin-Sulbactam (UNASYN) 3 g in sodium chloride 0.9 % 100 mL IVPB     3 g 100 mL/hr over 60 Minutes Intravenous  Once 03/07/14 2201 03/07/14 2325       LOS: 1 day   Corene Resnick M 03/08/2014, 7:36 AM

## 2014-03-08 NOTE — Care Management Note (Signed)
    Page 1 of 1   03/08/2014     10:23:10 AM CARE MANAGEMENT NOTE 03/08/2014  Patient:  Alexis Arnold, Alexis Arnold   Account Number:  [id number]  Date Initiated:  03/08/2014  Documentation initiated by:  Sunday Spillers  Subjective/Objective Assessment:   53 yo female admitted s/p cat bite with cellulitis, failed OP treatment. PTA lived at home alone.     Action/Plan:   Home when stable   Anticipated DC Date:  03/11/2014   Anticipated DC Plan:  [place]  CM consult      Choice offered to / List presented to:             Status of service:  Completed, signed off Medicare Important Message given?   (If response is "NO", the following Medicare IM given date fields will be blank) Date Medicare IM given:   Medicare IM given by:   Date Additional Medicare IM given:   Additional Medicare IM given by:    Discharge Disposition:  HOME/SELF CARE  Per UR Regulation:  Reviewed for med. necessity/level of care/duration of stay  If discussed at [place] of Stay Meetings, dates discussed:    Comments:

## 2014-03-08 NOTE — Consult Note (Signed)
Reason for Consult:  Right forearm infection s/p cat bite Referring Physician: EDP  Alexis Arnold is an 53 y.o. female.  HPI:   53 yo female well-known to me who was bitten by a cat this past Sunday. She started oral antibiotics with amoxicillin on Monday.  The past 24 hours her redness and pain increased on her right forearm and she came to the ED for further evaluation and treatment.  She was admitted for IV antibiotics to see if she would improve without the need for a surgical intervention.  She reports significant pain, but states that the redness has decreased since last evening.  Past Medical History  Diagnosis Date  . Hypothyroidism   . Anemia     Past Surgical History  Procedure Laterality Date  . Gastric bypass    . Cesarean section  1990  . Shoulder arthroscopy Right 2001  . Roux-en-y procedure  2004  . Combined mammaplasty revision w/ abdominoplasty  2014    Family History  Problem Relation Age of Onset  . Colon cancer Neg Hx   . Hypertension Brother     Social History:  reports that she has quit smoking. She has never used smokeless tobacco. She reports that she drinks alcohol. She reports that she does not use illicit drugs.  Allergies:  Allergies  Allergen Reactions  . Codeine Hives  . Penicillins Other (See Comments)    Patient is allergic to brethine(preservative) in injectables. Can take all others    Medications: I have reviewed the patient's current medications.  Results for orders placed during the hospital encounter of 03/07/14 (from the past 48 hour(s))  BASIC METABOLIC PANEL     Status: Abnormal   Collection Time    03/07/14 10:22 PM      Result Value Ref Range   Sodium 138  137 - 147 mEq/L   Potassium 4.1  3.7 - 5.3 mEq/L   Chloride 103  96 - 112 mEq/L   CO2 26  19 - 32 mEq/L   Glucose, Bld 89  70 - 99 mg/dL   BUN 16  6 - 23 mg/dL   Creatinine, Ser 1.16 (*) 0.50 - 1.10 mg/dL   Calcium 9.1  8.4 - 10.5 mg/dL   GFR calc non Af Amer 53 (*)  >90 mL/min   GFR calc Af Amer 61 (*) >90 mL/min   Comment: (NOTE)     The eGFR has been calculated using the CKD EPI equation.     This calculation has not been validated in all clinical situations.     eGFR's persistently <90 mL/min signify possible Chronic Kidney     Disease.   Anion gap 9  5 - 15  CBC WITH DIFFERENTIAL     Status: Abnormal   Collection Time    03/07/14 10:26 PM      Result Value Ref Range   WBC 12.0 (*) 4.0 - 10.5 K/uL   RBC 4.22  3.87 - 5.11 MIL/uL   Hemoglobin 13.0  12.0 - 15.0 g/dL   HCT 40.0  36.0 - 46.0 %   MCV 94.8  78.0 - 100.0 fL   MCH 30.8  26.0 - 34.0 pg   MCHC 32.5  30.0 - 36.0 g/dL   RDW 13.0  11.5 - 15.5 %   Platelets 333  150 - 400 K/uL   Neutrophils Relative % 71  43 - 77 %   Neutro Abs 8.5 (*) 1.7 - 7.7 K/uL   Lymphocytes Relative 20  12 - 46 %   Lymphs Abs 2.4  0.7 - 4.0 K/uL   Monocytes Relative 8  3 - 12 %   Monocytes Absolute 1.0  0.1 - 1.0 K/uL   Eosinophils Relative 1  0 - 5 %   Eosinophils Absolute 0.1  0.0 - 0.7 K/uL   Basophils Relative 0  0 - 1 %   Basophils Absolute 0.0  0.0 - 0.1 K/uL  BASIC METABOLIC PANEL     Status: Abnormal   Collection Time    03/08/14  5:00 AM      Result Value Ref Range   Sodium 138  137 - 147 mEq/L   Potassium 3.6 (*) 3.7 - 5.3 mEq/L   Chloride 105  96 - 112 mEq/L   CO2 23  19 - 32 mEq/L   Glucose, Bld 92  70 - 99 mg/dL   BUN 15  6 - 23 mg/dL   Creatinine, Ser 0.82  0.50 - 1.10 mg/dL   Calcium 8.4  8.4 - 10.5 mg/dL   GFR calc non Af Amer 80 (*) >90 mL/min   GFR calc Af Amer >90  >90 mL/min   Comment: (NOTE)     The eGFR has been calculated using the CKD EPI equation.     This calculation has not been validated in all clinical situations.     eGFR's persistently <90 mL/min signify possible Chronic Kidney     Disease.   Anion gap 10  5 - 15  CBC     Status: Abnormal   Collection Time    03/08/14  5:00 AM      Result Value Ref Range   WBC 10.7 (*) 4.0 - 10.5 K/uL   RBC 3.72 (*) 3.87 - 5.11  MIL/uL   Hemoglobin 11.4 (*) 12.0 - 15.0 g/dL   HCT 35.6 (*) 36.0 - 46.0 %   MCV 95.7  78.0 - 100.0 fL   MCH 30.6  26.0 - 34.0 pg   MCHC 32.0  30.0 - 36.0 g/dL   RDW 13.0  11.5 - 15.5 %   Platelets 272  150 - 400 K/uL    No results found.  Review of Systems  All other systems reviewed and are negative.  Blood pressure 99/68, pulse 57, temperature 97.4 F (36.3 C), temperature source Oral, resp. rate 18, height '5\' 6"'  (1.676 m), weight 67.994 kg (149 lb 14.4 oz), last menstrual period 03/05/2014, SpO2 100.00%. Physical Exam  Musculoskeletal:       Arms:   Assessment/Plan: Right forearm cellulitis and questionable extensor tenosynovitis post cat bite 1)  I spoke to Joaquim Lai in length and she understands that she will need irrigation and debridement of her forearm given the pus that I expressed from her bite wounds and her pain with continued cellulitis in spite of IV antibiotics.  She is NPO and gives informed consent for surgery this afternoon.  Kendahl Bumgardner Y 03/08/2014, 10:06 AM

## 2014-03-08 NOTE — Transfer of Care (Signed)
Immediate Anesthesia Transfer of Care Note  Patient: Alexis Arnold  Procedure(s) Performed: Procedure(s): MINOR IRRIGATION AND DEBRIDEMENT EXTREMITY (Right)  Patient Location: PACU  Anesthesia Type:General  Level of Consciousness: awake, alert  and oriented  Airway & Oxygen Therapy: Patient Spontanous Breathing and Patient connected to face mask oxygen  Post-op Assessment: Report given to PACU RN and Post -op Vital signs reviewed and stable  Post vital signs: Reviewed and stable  Complications: No apparent anesthesia complications

## 2014-03-08 NOTE — H&P (Signed)
Alexis Arnold is an 53 y.o. female.   PCP:   Donnajean Lopes, MD   Chief Complaint:  Cat Bite, R Arm Cellulitis.  HPI: 74 F c hypothyroid, Post menopausal and H/o DVT presents tonight after work c failed outpt Rx of R Arm cellulitis secondary to Cat bite.  She has been on Augmentin for 4 days as started by a PA at the [facility] where she works.  The Cat bite was 5 days ago.  She has been using Ibuprofen for pains.  The cat was hers and is reportedly mean.  It is UTD on its shots.  She tried calling Dr Carmelia Bake office today to get seen and was told there was a 2 week wait.  She states she cannot dorsiflex and has limited ROM of the wrist due to pain. She has worsening redness, soreness, and warmth to touch on the dorsum of forearm. She denies any fever, chills, numbness or tingling of the arm. She states she is right-hand dominant. Pt's tetanus vaccination is UTD.  In ED she was given Unasyn and IVF.  Dr Ninfa Linden was called and will see her in am.  I was called for inpt admission.   Past Medical History:  Past Medical History  Diagnosis Date  . Hypothyroidism   . Anemia     Past Surgical History  Procedure Laterality Date  . Gastric bypass    . Cesarean section  1990  . Shoulder arthroscopy Right 2001  . Roux-en-y procedure  2004  . Combined mammaplasty revision w/ abdominoplasty  2014      Allergies:   Allergies  Allergen Reactions  . Codeine Hives  . Penicillins Other (See Comments)    Patient is allergic to brethine(preservative) in injectables. Can take all others     Medications: Prior to Admission medications   Medication Sig Start Date End Date Taking? Authorizing Provider  amoxicillin-clavulanate (AUGMENTIN) 875-125 MG per tablet Take 1 tablet by mouth 2 (two) times daily.   Yes Historical Provider, MD  cyanocobalamin (,VITAMIN B-12,) 1000 MCG/ML injection Inject 1,000 mcg into the muscle every 30 (thirty) days.    Yes Historical Provider, MD  estrogens, conjugated,  (PREMARIN) 1.25 MG tablet Take 1.25 mg by mouth daily.   Yes Historical Provider, MD  ibuprofen (ADVIL,MOTRIN) 200 MG tablet Take 800 mg by mouth every 6 (six) hours as needed for moderate pain.    Yes Historical Provider, MD  levothyroxine (SYNTHROID, LEVOTHROID) 150 MCG tablet Take 150 mcg by mouth daily.   Yes Historical Provider, MD  Multiple Vitamin (MULTIVITAMIN) tablet Take 1 tablet by mouth daily.   Yes Historical Provider, MD      (Not in a hospital admission)   Social History:  reports that she has quit smoking. She has never used smokeless tobacco. She reports that she drinks alcohol. She reports that she does not use illicit drugs.  Family History: Family History  Problem Relation Age of Onset  . Colon cancer Neg Hx     Review of Systems:  Review of Systems - full ROS obtained. See HPI. No Fevers. + Pain. No CP or SOB   Physical Exam:  Blood pressure 123/67, pulse 86, temperature 98.1 F (36.7 C), temperature source Oral, resp. rate 16, last menstrual period 03/05/2014, SpO2 100.00%. Filed Vitals:   03/07/14 2155  BP: 123/67  Pulse: 86  Temp: 98.1 F (36.7 C)  TempSrc: Oral  Resp: 16  SpO2: 100%   General appearance: A and O Head: Normocephalic, without obvious  abnormality, atraumatic Eyes: conjunctivae/corneas clear. PERRL, EOM's intact.  Nose: Nares normal. Septum midline. Mucosa normal. No drainage or sinus tenderness. Throat: lips, mucosa, and tongue normal; teeth and gums normal Neck: no adenopathy, no carotid bruit, no JVD and thyroid not enlarged, symmetric, no tenderness/mass/nodules Resp: CTA Cardio: Reg GI: soft, non-tender; bowel sounds normal; no masses,  no organomegaly Extremities: UE r forearm c Cellulitis, warmth, redness, swelling and tenderness.  Cat teeth puncture wound noted.  No pus. @ 5-6 cm of cellulitis.  Does not extend into hand.  The pain c hand and wrist movement is because of swelling at tendon site. Pulses: 2+ and  symmetric Lymph nodes:  no cervical lymphadenopathy Neurologic: Alert and oriented X 3, normal strength and tone. Normal symmetric reflexes.     Labs on Admission:   Recent Labs  03/07/14 2222  NA 138  K 4.1  CL 103  CO2 26  GLUCOSE 89  BUN 16  CREATININE 1.16*  CALCIUM 9.1   No results found for this basename: AST, ALT, ALKPHOS, BILITOT, PROT, ALBUMIN,  in the last 72 hours No results found for this basename: LIPASE, AMYLASE,  in the last 72 hours  Recent Labs  03/07/14 2226  WBC 12.0*  NEUTROABS 8.5*  HGB 13.0  HCT 40.0  MCV 94.8  PLT 333   No results found for this basename: CKTOTAL, CKMB, CKMBINDEX, TROPONINI,  in the last 72 hours No results found for this basename: INR,  PROTIME     LAB RESULT POCT:  Results for orders placed during the hospital encounter of 03/07/14  CBC WITH DIFFERENTIAL      Result Value Ref Range   WBC 12.0 (*) 4.0 - 10.5 K/uL   RBC 4.22  3.87 - 5.11 MIL/uL   Hemoglobin 13.0  12.0 - 15.0 g/dL   HCT 40.0  36.0 - 46.0 %   MCV 94.8  78.0 - 100.0 fL   MCH 30.8  26.0 - 34.0 pg   MCHC 32.5  30.0 - 36.0 g/dL   RDW 13.0  11.5 - 15.5 %   Platelets 333  150 - 400 K/uL   Neutrophils Relative % 71  43 - 77 %   Neutro Abs 8.5 (*) 1.7 - 7.7 K/uL   Lymphocytes Relative 20  12 - 46 %   Lymphs Abs 2.4  0.7 - 4.0 K/uL   Monocytes Relative 8  3 - 12 %   Monocytes Absolute 1.0  0.1 - 1.0 K/uL   Eosinophils Relative 1  0 - 5 %   Eosinophils Absolute 0.1  0.0 - 0.7 K/uL   Basophils Relative 0  0 - 1 %   Basophils Absolute 0.0  0.0 - 0.1 K/uL  BASIC METABOLIC PANEL      Result Value Ref Range   Sodium 138  137 - 147 mEq/L   Potassium 4.1  3.7 - 5.3 mEq/L   Chloride 103  96 - 112 mEq/L   CO2 26  19 - 32 mEq/L   Glucose, Bld 89  70 - 99 mg/dL   BUN 16  6 - 23 mg/dL   Creatinine, Ser 1.16 (*) 0.50 - 1.10 mg/dL   Calcium 9.1  8.4 - 10.5 mg/dL   GFR calc non Af Amer 53 (*) >90 mL/min   GFR calc Af Amer 61 (*) >90 mL/min   Anion gap 9  5 - 15       Radiological Exams on Admission: No results found.  No orders found for this or any previous visit.   Assessment/Plan Active Problems:   Cellulitis of forearm, right   Cat bite   Hypothyroid  R forearm Cellulitis d/t Cat bite which has failed Outpt Augmentin.  Mild Leukocytosis. Admit.  IVF. Pain Control.  Unasyn (She tolerates Augmentin and Unasyn despite PCN Allergy and is comfortable continuing?).  One dose Clinda.  Dr Ninfa Linden to see in a few hrs.  DVT prophylaxis.  Home meds.  Probiotics  Full code.   Baine Decesare M 03/08/2014, 1:13 AM

## 2014-03-08 NOTE — Anesthesia Preprocedure Evaluation (Signed)
Anesthesia Evaluation  Patient identified by MRN, date of birth, ID band Patient awake    Reviewed: Allergy & Precautions, H&P , NPO status , Patient's Chart, lab work & pertinent test results  Airway Mallampati: II TM Distance: >3 FB Neck ROM: Full    Dental no notable dental hx.    Pulmonary former smoker,  breath sounds clear to auscultation  Pulmonary exam normal       Cardiovascular Exercise Tolerance: Good negative cardio ROS  Rhythm:Regular Rate:Normal     Neuro/Psych negative neurological ROS  negative psych ROS   GI/Hepatic negative GI ROS, Neg liver ROS,   Endo/Other  Hypothyroidism   Renal/GU negative Renal ROS  negative genitourinary   Musculoskeletal negative musculoskeletal ROS (+)   Abdominal   Peds negative pediatric ROS (+)  Hematology  (+) anemia ,   Anesthesia Other Findings   Reproductive/Obstetrics negative OB ROS                           Anesthesia Physical Anesthesia Plan  ASA: I  Anesthesia Plan: General   Post-op Pain Management:    Induction: Intravenous  Airway Management Planned: LMA  Additional Equipment:   Intra-op Plan:   Post-operative Plan: Extubation in OR  Informed Consent: I have reviewed the patients History and Physical, chart, labs and discussed the procedure including the risks, benefits and alternatives for the proposed anesthesia with the patient or authorized representative who has indicated his/her understanding and acceptance.   Dental advisory given  Plan Discussed with: CRNA  Anesthesia Plan Comments:         Anesthesia Quick Evaluation

## 2014-03-08 NOTE — Brief Op Note (Signed)
03/07/2014 - 03/08/2014  6:12 PM  PATIENT:  Alexis Arnold  53 y.o. female  PRE-OPERATIVE DIAGNOSIS:  infected cat bite  POST-OPERATIVE DIAGNOSIS:  infected cat bite  PROCEDURE:  Procedure(s): MINOR IRRIGATION AND DEBRIDEMENT EXTREMITY (Right)  SURGEON:  Surgeon(s) and Role:    * Mcarthur Rossetti, MD - Primary  ANESTHESIA:   general  EBL:  Total I/O In: 50 [P.O.:20; I.V.:240; IV Piggyback:200] Out: 150 [Urine:150]  BLOOD ADMINISTERED:none  DRAINS: none   LOCAL MEDICATIONS USED:  NONE  SPECIMEN:  No Specimen  DISPOSITION OF SPECIMEN:  N/A  COUNTS:  YES  TOURNIQUET:   Total Tourniquet Time Documented: Upper Arm (Right) - 6 minutes Total: Upper Arm (Right) - 6 minutes   DICTATION: .Other Dictation: Dictation Number [id number]  PLAN OF CARE: Admit to inpatient   PATIENT DISPOSITION:  PACU - hemodynamically stable.   Delay start of Pharmacological VTE agent (>24hrs) due to surgical blood loss or risk of bleeding: no

## 2014-03-08 NOTE — ED Provider Notes (Signed)
Medical screening examination/treatment/procedure(s) were conducted as a shared visit with non-physician practitioner(s) and myself.  I personally evaluated the patient during the encounter.   EKG Interpretation None      Patient is one of our former physician assistants. Was bitten on her right arm by her cat. Started on outpatient Augmentin. Reports increasing redness and swelling as well as pain to the site. Also reports difficulty with flexion/extension of the wrist and fingers.  Denies fever. There are multiple puncture wounds noted over the right forearm with erythema and swelling.  Fluctuance and induration noted under 1 of the puncture sites. Patient is not wish to stay in the colon however, she appears to be failing outpatient antibiotics. Discussed with Dr. Ninfa Linden on call for hand surgery with concerns for deep space infection. He also recommends admission for IV antibiotics. Discuss with patient now agrees to stay to receive IV antibiotics. Will be assessed by Dr. Ninfa Linden on in the morning.  Merryl Hacker, MD 03/08/14 2259

## 2014-03-09 MED ORDER — TRAMADOL HCL 50 MG PO TABS: 100.0000 mg | ORAL_TABLET | Freq: Four times a day (QID) | ORAL | Status: DC | PRN

## 2014-03-09 NOTE — Progress Notes (Signed)
Subjective: 1 Day Post-Op Procedure(s) (LRB): MINOR IRRIGATION AND DEBRIDEMENT EXTREMITY (Right) Patient reports pain as mild.    Objective: Vital signs in last 24 hours: Temp:  [97.4 F (36.3 C)-98 F (36.7 C)] 97.8 F (36.6 C) (08/08 0525) Pulse Rate:  [55-71] 57 (08/08 0525) Resp:  [9-19] 18 (08/08 0525) BP: (92-122)/(54-70) 115/66 mmHg (08/08 0525) SpO2:  [95 %-100 %] 97 % (08/08 0525)  Intake/Output from previous day: 08/07 0701 - 08/08 0700 In: 1640 [P.O.:20; I.V.:1420; IV Piggyback:200] Out: 525 [Urine:525] Intake/Output this shift:     Recent Labs  03/07/14 2226 03/08/14 0500  HGB 13.0 11.4*    Recent Labs  03/07/14 2226 03/08/14 0500  WBC 12.0* 10.7*  RBC 4.22 3.72*  HCT 40.0 35.6*  PLT 333 272    Recent Labs  03/07/14 2222 03/08/14 0500  NA 138 138  K 4.1 3.6*  CL 103 105  CO2 26 23  BUN 16 15  CREATININE 1.16* 0.82  GLUCOSE 89 92  CALCIUM 9.1 8.4   No results found for this basename: LABPT, INR,  in the last 72 hours  Sensation intact distally Intact pulses distally Incision: no drainage No cellulitis present Compartment soft  Assessment/Plan: 1 Day Post-Op Procedure(s) (LRB): MINOR IRRIGATION AND DEBRIDEMENT EXTREMITY (Right) Can discharge to home today on oral antibiotics.  Alexis Arnold 03/09/2014, 9:09 AM

## 2014-03-09 NOTE — Op Note (Signed)
NAMEJAYONA, MCCAIG NO.:  [id number]  MEDICAL RECORD NO.:  [id number]  LOCATION:  [place]                         FACILITY:  [facility]  PHYSICIAN:  Lind Guest. Ninfa Linden, M.D.DATE OF BIRTH:  Jul 25, 1961  DATE OF PROCEDURE:  03/08/2014 DATE OF DISCHARGE:                              OPERATIVE REPORT   PREOPERATIVE DIAGNOSES:  Right dorsal forearm cellulitis and extensor tenosynovitis status post cat bite.  POSTOPERATIVE DIAGNOSES:  Right dorsal forearm cellulitis and extensor tenosynovitis status post cat bite.  PROCEDURES:  Irrigation and debridement of right forearm dorsal compartment and extensor tendons with debridement of only necrotic skin and thorough irrigation.  FINDINGS:  Edema fluid and small amount of gross purulence, dorsal right forearm.  SURGEON:  Lind Guest. Ninfa Linden, M.D.  ANESTHESIA:  General.  ESTIMATED BLOOD LOSS:  Minimal.  TOURNIQUET TIME:  Less than 15 minutes.  COMPLICATIONS:  None.  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  Starting Monday, she started taking Augmentin 875 mg twice a day.  By yesterday evening, she was developing a cellulitis that was extending from the bite area on the mid dorsal forearm up to the elbow and down the wrist, with a lot of wrist pain. She was admitted to the Medicine Service last night and started her on Unasyn IV.  We saw her this morning and she was n.p.o.  From one of the bite wounds, I could express some gross purulence.  Even though her cellulitis, this is thought significantly she is having a decent amount of dorsal wrist pain and forearm pain.  This definitely at this point warrants an open irrigation.  She understands this completely and does consent to proceed with surgery.  PROCEDURE DESCRIPTION:  After informed consent was obtained, appropriate right arm  was marked.  She was brought to the operating room, placed supine on the operating room table with the right arm on arm table. General anesthesia was then obtained.  A nonsterile tourniquet was placed around her upper right arm and her right forearm and hand were prepped and draped with DuraPrep and sterile drapes.  Time-out was called.  She was identified as correct patient, correct right arm.  I then elevated the extremity and had tourniquet inflated to 250 mmHg of pressure.  I then made a midline incision directly over the dorsal forearm where the bite was.  I was able to remove necrotic skin.  We did found edematous tissue around the dorsal tissue and the dorsal extensor tendons.  I opened up the tendon sheath and then copiously irrigated the whole dorsal forearm compartment with normal saline solution as well as bacitracin solution.  Once this was done, we let the tourniquet down and hemostasis was obtained with electrocautery.  We loosely approximated the skin with interrupted 3-0 nylon suture.  Xeroform and well-padded sterile dressing were applied.  She was awakened, extubated, and taken to the recovery room in stable condition.  All final counts were correct.  There were no complications noted.  Postoperatively, we will keep her on IV antibiotics overnight, will likely discharge on  oral antibiotics tomorrow.     Lind Guest. Ninfa Linden, M.D.     CYB/MEDQ  D:  03/08/2014  T:  03/09/2014  Job:  [id number]

## 2014-03-09 NOTE — Discharge Summary (Signed)
Patient ID: Alexis Arnold MRN: [id number] DOB/AGE: 1960/10/10 53 y.o.  Admit date: 03/07/2014 Discharge date: 03/09/2014  Admission Diagnoses:  Active Problems:   Cellulitis of forearm, right   Cat bite   Hypothyroid   Discharge Diagnoses:  Same  Past Medical History  Diagnosis Date  . Hypothyroidism   . Anemia     Surgeries: Procedure(s): MINOR IRRIGATION AND DEBRIDEMENT EXTREMITY on 03/07/2014 - 03/08/2014   Consultants: Treatment Team:  Mcarthur Rossetti, MD Precious Reel, MD  Discharged Condition: Improved  Hospital Course: Alexis Arnold is an 53 y.o. female who was admitted 03/07/2014 for operative treatment of<principal problem not specified>. Patient has severe unremitting pain that affects sleep, daily activities, and work/hobbies. After pre-op clearance the patient was taken to the operating room on 03/07/2014 - 03/08/2014 and underwent  Procedure(s): MINOR IRRIGATION AND DEBRIDEMENT EXTREMITY.    Patient was given perioperative antibiotics: Anti-infectives   Start     Dose/Rate Route Frequency Ordered Stop   03/08/14 1753  polymyxin B 500,000 Units, bacitracin 50,000 Units in sodium chloride irrigation 0.9 % 500 mL irrigation  Status:  Discontinued       As needed 03/08/14 1753 03/08/14 1809   03/08/14 1000  clindamycin (CLEOCIN) IVPB 600 mg     600 mg 100 mL/hr over 30 Minutes Intravenous  Once 03/08/14 0753 03/08/14 1041   03/08/14 0600  Ampicillin-Sulbactam (UNASYN) 3 g in sodium chloride 0.9 % 100 mL IVPB     3 g 100 mL/hr over 60 Minutes Intravenous Every 6 hours 03/08/14 0208     03/08/14 0215  clindamycin (CLEOCIN) IVPB 600 mg     600 mg 100 mL/hr over 30 Minutes Intravenous  Once 03/08/14 0208 03/08/14 0302   03/07/14 2215  Ampicillin-Sulbactam (UNASYN) 3 g in sodium chloride 0.9 % 100 mL IVPB     3 g 100 mL/hr over 60 Minutes Intravenous  Once 03/07/14 2201 03/07/14 2325       Patient was given sequential compression devices, early ambulation, and  chemoprophylaxis to prevent DVT.  Patient benefited maximally from hospital stay and there were no complications.    Recent vital signs: Patient Vitals for the past 24 hrs:  BP Temp Temp src Pulse Resp SpO2  03/09/14 0525 115/66 mmHg 97.8 F (36.6 C) Oral 57 18 97 %  03/09/14 0130 107/58 mmHg 97.9 F (36.6 C) Oral 59 16 95 %  03/08/14 2230 92/54 mmHg 98 F (36.7 C) Oral 66 18 95 %  03/08/14 2115 106/60 mmHg 97.7 F (36.5 C) Oral 63 18 97 %  03/08/14 2015 121/62 mmHg 97.7 F (36.5 C) Oral 71 18 99 %  03/08/14 1852 122/67 mmHg 97.7 F (36.5 C) - 61 16 100 %  03/08/14 1845 115/62 mmHg - - 59 13 100 %  03/08/14 1842 - - - 60 13 100 %  03/08/14 1839 - - - 61 19 98 %  03/08/14 1830 110/64 mmHg - - 55 9 99 %  03/08/14 1816 109/66 mmHg 97.4 F (36.3 C) - 60 11 99 %  03/08/14 1400 117/70 mmHg 97.8 F (36.6 C) Oral 58 16 99 %     Recent laboratory studies:  Recent Labs  03/07/14 2222 03/07/14 2226 03/08/14 0500  WBC  --  12.0* 10.7*  HGB  --  13.0 11.4*  HCT  --  40.0 35.6*  PLT  --  333 272  NA 138  --  138  K 4.1  --  3.6*  CL 103  --  105  CO2 26  --  23  BUN 16  --  15  CREATININE 1.16*  --  0.82  GLUCOSE 89  --  92  CALCIUM 9.1  --  8.4     Discharge Medications:     Medication List         amoxicillin-clavulanate 875-125 MG per tablet  Commonly known as:  AUGMENTIN  Take 1 tablet by mouth 2 (two) times daily.     cyanocobalamin 1000 MCG/ML injection  Commonly known as:  (VITAMIN B-12)  Inject 1,000 mcg into the muscle every 30 (thirty) days.     estrogens (conjugated) 1.25 MG tablet  Commonly known as:  PREMARIN  Take 1.25 mg by mouth daily.     ibuprofen 200 MG tablet  Commonly known as:  ADVIL,MOTRIN  Take 800 mg by mouth every 6 (six) hours as needed for moderate pain.     levothyroxine 150 MCG tablet  Commonly known as:  SYNTHROID, LEVOTHROID  Take 150 mcg by mouth daily.     multivitamin tablet  Take 1 tablet by mouth daily.     traMADol  50 MG tablet  Commonly known as:  ULTRAM  Take 2 tablets (100 mg total) by mouth every 6 (six) hours as needed for moderate pain.        Diagnostic Studies: No results found.  Disposition: 01-Home or Self Care      Discharge Instructions   Call MD / Call 911    Complete by:  As directed   If you experience chest pain or shortness of breath, CALL 911 and be transported to the hospital emergency room.  If you develope a fever above 101 F, pus (white drainage) or increased drainage or redness at the wound, or calf pain, call your surgeon's office.     Constipation Prevention    Complete by:  As directed   Drink plenty of fluids.  Prune juice may be helpful.  You may use a stool softener, such as Colace (over the counter) 100 mg twice a day.  Use MiraLax (over the counter) for constipation as needed.     Diet - low sodium heart healthy    Complete by:  As directed      Discharge instructions    Complete by:  As directed   Increase activities as comfort allows. Elevation and ice as needed for swelling. Can get your current dressing wet in the shower. Place a small piece of Xeroform or a small layer of triple antibiotic ointment on your incision/wounds daily. Can get your actual incision wet in the shower starting 03/12/14.     Discharge patient    Complete by:  As directed      Increase activity slowly as tolerated    Complete by:  As directed            Follow-up Information   Follow up with Mcarthur Rossetti, MD. Schedule an appointment as soon as possible for a visit in 2 weeks.   Specialty:  Orthopedic Surgery   Contact information:   [place] [address] [phone]        Signed: Mcarthur Rossetti 03/09/2014, 9:12 AM

## 2014-03-09 NOTE — Anesthesia Postprocedure Evaluation (Signed)
  Anesthesia Post-op Note  Patient: Alexis Arnold  Procedure(s) Performed: Procedure(s) (LRB): MINOR IRRIGATION AND DEBRIDEMENT EXTREMITY (Right)  Patient Location: PACU  Anesthesia Type: General  Level of Consciousness: awake and alert   Airway and Oxygen Therapy: Patient Spontanous Breathing  Post-op Pain: mild  Post-op Assessment: Post-op Vital signs reviewed, Patient's Cardiovascular Status Stable, Respiratory Function Stable, Patent Airway and No signs of Nausea or vomiting  Last Vitals:  Filed Vitals:   03/09/14 0525  BP: 115/66  Pulse: 57  Temp: 36.6 C  Resp: 18    Post-op Vital Signs: stable   Complications: No apparent anesthesia complications

## 2014-03-12 ENCOUNTER — Encounter (HOSPITAL_COMMUNITY): Payer: Self-pay | Admitting: Orthopaedic Surgery

## 2014-04-19 ENCOUNTER — Other Ambulatory Visit: Payer: Self-pay | Admitting: Obstetrics and Gynecology

## 2014-04-22 LAB — CYTOLOGY - PAP

## 2014-08-02 HISTORY — PX: CATARACT EXTRACTION W/ INTRAOCULAR LENS  IMPLANT, BILATERAL: SHX1307

## 2014-08-19 ENCOUNTER — Other Ambulatory Visit (HOSPITAL_COMMUNITY): Payer: Self-pay | Admitting: *Deleted

## 2014-08-20 ENCOUNTER — Encounter (HOSPITAL_COMMUNITY)
Admission: RE | Admit: 2014-08-20 | Discharge: 2014-08-20 | Disposition: A | Discharge status: Home / Self Care | Source: Ambulatory Visit | Attending: Internal Medicine | Admitting: Internal Medicine

## 2014-08-20 DIAGNOSIS — Z5181 Encounter for therapeutic drug level monitoring: Secondary | ICD-10-CM | POA: Diagnosis not present

## 2014-08-20 DIAGNOSIS — D649 Anemia, unspecified: Secondary | ICD-10-CM | POA: Diagnosis not present

## 2014-08-20 MED ORDER — SODIUM CHLORIDE 0.9 % IV SOLN
510.0000 mg | INTRAVENOUS | Status: DC
  Administered 2014-08-20: 510 mg via INTRAVENOUS
  Filled 2014-08-20: qty 17

## 2014-08-30 ENCOUNTER — Encounter (HOSPITAL_COMMUNITY)
Admission: RE | Admit: 2014-08-30 | Discharge: 2014-08-30 | Disposition: A | Discharge status: Home / Self Care | Source: Ambulatory Visit | Attending: Internal Medicine | Admitting: Internal Medicine

## 2014-08-30 DIAGNOSIS — D649 Anemia, unspecified: Secondary | ICD-10-CM | POA: Diagnosis not present

## 2014-08-30 MED ORDER — SODIUM CHLORIDE 0.9 % IV SOLN
510.0000 mg | INTRAVENOUS | Status: AC
  Administered 2014-08-30: 510 mg via INTRAVENOUS
  Filled 2014-08-30: qty 17

## 2015-11-17 ENCOUNTER — Encounter (HOSPITAL_BASED_OUTPATIENT_CLINIC_OR_DEPARTMENT_OTHER): Payer: Self-pay | Admitting: *Deleted

## 2015-11-17 NOTE — Progress Notes (Signed)
NPO AFTER MN.  ARRIVE AT 0600.  NEEDS HG.  

## 2015-11-20 NOTE — Anesthesia Preprocedure Evaluation (Addendum)
Anesthesia Evaluation  Patient identified by MRN, date of birth, ID band Patient awake    Reviewed: Allergy & Precautions, NPO status , Patient's Chart, lab work & pertinent test results  Airway Mallampati: I  TM Distance: >3 FB Neck ROM: Full    Dental  (+) Teeth Intact   Pulmonary former smoker,    breath sounds clear to auscultation       Cardiovascular negative cardio ROS   Rhythm:Regular Rate:Normal     Neuro/Psych negative neurological ROS  negative psych ROS   GI/Hepatic negative GI ROS, Neg liver ROS,   Endo/Other  Hypothyroidism   Renal/GU negative Renal ROS  negative genitourinary   Musculoskeletal negative musculoskeletal ROS (+)   Abdominal   Peds negative pediatric ROS (+)  Hematology negative hematology ROS (+)   Anesthesia Other Findings   Reproductive/Obstetrics negative OB ROS                           Anesthesia Physical Anesthesia Plan  ASA: II  Anesthesia Plan: General   Post-op Pain Management:    Induction: Intravenous  Airway Management Planned: LMA  Additional Equipment:   Intra-op Plan:   Post-operative Plan: Extubation in OR  Informed Consent: I have reviewed the patients History and Physical, chart, labs and discussed the procedure including the risks, benefits and alternatives for the proposed anesthesia with the patient or authorized representative who has indicated his/her understanding and acceptance.   Dental advisory given  Plan Discussed with: CRNA  Anesthesia Plan Comments: (Possible MAC if patient/surgeon preference. )       Anesthesia Quick Evaluation

## 2015-11-20 NOTE — H&P (Addendum)
Alexis Arnold is an 55 y.o. female G1 P1 presents for surgical mngt and evaluation of PMB.     Menstrual History: Patient's last menstrual period was 03/05/2014.    Past Medical History  Diagnosis Date  . Hypothyroidism   . History of adenomatous polyp of colon     tubular adenoma 10-31-2013  . Endometrial mass   . History of DVT of lower extremity     right lower extremity 07-30-2008  . PMB (postmenopausal bleeding)     Past Surgical History  Procedure Laterality Date  . Cesarean section  1990  . Shoulder arthroscopy Right 2001  . I&d extremity Right 03/08/2014    Procedure: MINOR IRRIGATION AND DEBRIDEMENT EXTREMITY;  Surgeon: Mcarthur Rossetti, MD;  Location: [facility];  Service: Orthopedics;  Laterality: Right;  . Roux-en-y gastric bypass  2004  . Combined reduction mammaplasty w/ abdominoplasty  2014  . Colonoscopy  10-31-2013  . Cataract extraction w/ intraocular lens  implant, bilateral  2016    Family History  Problem Relation Age of Onset  . Colon cancer Neg Hx   . Hypertension Brother     Social History:  reports that she quit smoking about 17 years ago. Her smoking use included Cigarettes. She quit after 14 years of use. She has never used smokeless tobacco. She reports that she drinks alcohol. She reports that she does not use illicit drugs.  Allergies:  Allergies  Allergen Reactions  . Codeine Hives  . Penicillins Other (See Comments)    Patient has anaphylactic reaction  to brethine(preservative) in injectables. Can take all others    No prescriptions prior to admission    ROS  Height 5\' 5"  (1.651 m), weight 150 lb (68.04 kg), last menstrual period 03/05/2014. Physical Exam  Gen - NAD CV - RRR Lungs - clear Abd - soft, NT/ND  [place]:  56mm polypoid mass in endometrial cavity  Assessment/Plan: PMB Hysteroscopy, D&C R/b/a discussed, questions answered, informed consent  Alexis Arnold 11/20/2015, 6:00 PM

## 2015-11-21 ENCOUNTER — Encounter (HOSPITAL_BASED_OUTPATIENT_CLINIC_OR_DEPARTMENT_OTHER)
Admission: RE | Disposition: A | Discharge status: Home / Self Care | Payer: Self-pay | Source: Ambulatory Visit | Attending: Obstetrics and Gynecology

## 2015-11-21 ENCOUNTER — Ambulatory Visit (HOSPITAL_BASED_OUTPATIENT_CLINIC_OR_DEPARTMENT_OTHER)
Admission: RE | Admit: 2015-11-21 | Discharge: 2015-11-21 | Disposition: A | Discharge status: Home / Self Care | Source: Ambulatory Visit | Attending: Obstetrics and Gynecology | Admitting: Obstetrics and Gynecology

## 2015-11-21 ENCOUNTER — Ambulatory Visit (HOSPITAL_BASED_OUTPATIENT_CLINIC_OR_DEPARTMENT_OTHER): Admitting: Anesthesiology

## 2015-11-21 ENCOUNTER — Encounter (HOSPITAL_BASED_OUTPATIENT_CLINIC_OR_DEPARTMENT_OTHER): Payer: Self-pay | Admitting: *Deleted

## 2015-11-21 DIAGNOSIS — N950 Postmenopausal bleeding: Secondary | ICD-10-CM | POA: Insufficient documentation

## 2015-11-21 DIAGNOSIS — Z87891 Personal history of nicotine dependence: Secondary | ICD-10-CM | POA: Insufficient documentation

## 2015-11-21 DIAGNOSIS — Z86718 Personal history of other venous thrombosis and embolism: Secondary | ICD-10-CM | POA: Insufficient documentation

## 2015-11-21 DIAGNOSIS — E039 Hypothyroidism, unspecified: Secondary | ICD-10-CM | POA: Insufficient documentation

## 2015-11-21 DIAGNOSIS — Z9884 Bariatric surgery status: Secondary | ICD-10-CM | POA: Insufficient documentation

## 2015-11-21 HISTORY — DX: Personal history of adenomatous and serrated colon polyps: Z86.0101

## 2015-11-21 HISTORY — DX: Other specified conditions associated with female genital organs and menstrual cycle: N94.89

## 2015-11-21 HISTORY — DX: Postmenopausal bleeding: N95.0

## 2015-11-21 HISTORY — DX: Personal history of colonic polyps: Z86.010

## 2015-11-21 HISTORY — PX: HYSTEROSCOPY W/D&C: SHX1775

## 2015-11-21 HISTORY — DX: Personal history of other venous thrombosis and embolism: Z86.718

## 2015-11-21 LAB — POCT HEMOGLOBIN-HEMACUE: Hemoglobin: 8.8 g/dL — ABNORMAL LOW (ref 12.0–15.0)

## 2015-11-21 SURGERY — DILATATION AND CURETTAGE /HYSTEROSCOPY
Anesthesia: General | Site: Uterus

## 2015-11-21 MED ORDER — FENTANYL CITRATE (PF) 100 MCG/2ML IJ SOLN
INTRAMUSCULAR | Status: AC
  Filled 2015-11-21: qty 2

## 2015-11-21 MED ORDER — PROPOFOL 10 MG/ML IV BOLUS
INTRAVENOUS | Status: DC | PRN
  Administered 2015-11-21: 150 mg via INTRAVENOUS

## 2015-11-21 MED ORDER — FENTANYL CITRATE (PF) 100 MCG/2ML IJ SOLN
INTRAMUSCULAR | Status: DC | PRN
  Administered 2015-11-21: 25 ug via INTRAVENOUS

## 2015-11-21 MED ORDER — SODIUM CHLORIDE 0.9 % IR SOLN
Status: DC | PRN
  Administered 2015-11-21: 3000 mL

## 2015-11-21 MED ORDER — DEXAMETHASONE SODIUM PHOSPHATE 10 MG/ML IJ SOLN
INTRAMUSCULAR | Status: AC
Start: 2015-11-21 — End: 2015-11-21
  Filled 2015-11-21: qty 1

## 2015-11-21 MED ORDER — KETOROLAC TROMETHAMINE 30 MG/ML IJ SOLN
INTRAMUSCULAR | Status: DC | PRN
  Administered 2015-11-21: 30 mg via INTRAVENOUS

## 2015-11-21 MED ORDER — LACTATED RINGERS IV SOLN
INTRAVENOUS | Status: DC
  Administered 2015-11-21: 07:00:00 via INTRAVENOUS
  Filled 2015-11-21: qty 1000

## 2015-11-21 MED ORDER — LIDOCAINE HCL 1 % IJ SOLN
INTRAMUSCULAR | Status: DC | PRN
  Administered 2015-11-21: 10 mL

## 2015-11-21 MED ORDER — KETOROLAC TROMETHAMINE 30 MG/ML IJ SOLN
INTRAMUSCULAR | Status: AC
  Filled 2015-11-21: qty 1

## 2015-11-21 MED ORDER — ONDANSETRON HCL 4 MG/2ML IJ SOLN
INTRAMUSCULAR | Status: DC | PRN
  Administered 2015-11-21: 4 mg via INTRAVENOUS

## 2015-11-21 MED ORDER — PROPOFOL 10 MG/ML IV BOLUS
INTRAVENOUS | Status: AC
  Filled 2015-11-21: qty 40

## 2015-11-21 MED ORDER — WHITE PETROLATUM GEL
Status: AC
  Filled 2015-11-21: qty 5

## 2015-11-21 MED ORDER — DEXAMETHASONE SODIUM PHOSPHATE 4 MG/ML IJ SOLN
INTRAMUSCULAR | Status: DC | PRN
  Administered 2015-11-21: 10 mg via INTRAVENOUS

## 2015-11-21 MED ORDER — ONDANSETRON HCL 4 MG/2ML IJ SOLN
INTRAMUSCULAR | Status: AC
  Filled 2015-11-21: qty 2

## 2015-11-21 MED ORDER — MIDAZOLAM HCL 5 MG/5ML IJ SOLN
INTRAMUSCULAR | Status: DC | PRN
  Administered 2015-11-21: 2 mg via INTRAVENOUS

## 2015-11-21 MED ORDER — LIDOCAINE HCL (CARDIAC) 20 MG/ML IV SOLN
INTRAVENOUS | Status: DC | PRN
  Administered 2015-11-21: 80 mg via INTRAVENOUS

## 2015-11-21 MED ORDER — LIDOCAINE HCL (CARDIAC) 20 MG/ML IV SOLN
INTRAVENOUS | Status: AC
Start: 2015-11-21 — End: 2015-11-21
  Filled 2015-11-21: qty 5

## 2015-11-21 MED ORDER — PHENYLEPHRINE HCL 10 MG/ML IJ SOLN
INTRAMUSCULAR | Status: DC | PRN
Start: 2015-11-21 — End: 2015-11-21
  Administered 2015-11-21: 80 ug via INTRAVENOUS

## 2015-11-21 MED ORDER — CLINDAMYCIN PHOSPHATE 900 MG/50ML IV SOLN
INTRAVENOUS | Status: AC
  Filled 2015-11-21: qty 50

## 2015-11-21 MED ORDER — MIDAZOLAM HCL 2 MG/2ML IJ SOLN
INTRAMUSCULAR | Status: AC
Start: 2015-11-21 — End: 2015-11-21
  Filled 2015-11-21: qty 2

## 2015-11-21 MED ORDER — CLINDAMYCIN PHOSPHATE 900 MG/50ML IV SOLN
900.0000 mg | Freq: Once | INTRAVENOUS | Status: AC
  Administered 2015-11-21: 900 mg via INTRAVENOUS
  Filled 2015-11-21: qty 50

## 2015-11-21 MED ORDER — IBUPROFEN 800 MG PO TABS: 800.0000 mg | ORAL_TABLET | Freq: Four times a day (QID) | ORAL | Status: DC | PRN

## 2015-11-21 SURGICAL SUPPLY — 27 items
ABLATOR ENDOMETRIAL BIPOLAR (ABLATOR) IMPLANT
CANISTER SUCTION 2500CC (MISCELLANEOUS) ×3 IMPLANT
CATH ROBINSON RED A/P 16FR (CATHETERS) ×3 IMPLANT
COVER BACK TABLE 60X90IN (DRAPES) ×3 IMPLANT
DEVICE MYOSURE CLASSIC (MISCELLANEOUS) IMPLANT
DEVICE MYOSURE LITE (MISCELLANEOUS) IMPLANT
DRAPE HYSTEROSCOPY (DRAPE) ×3 IMPLANT
DRAPE LG THREE QUARTER DISP (DRAPES) ×3 IMPLANT
GLOVE BIO SURGEON STRL SZ 6.5 (GLOVE) ×4 IMPLANT
GLOVE BIO SURGEON STRL SZ7.5 (GLOVE) ×3 IMPLANT
GLOVE BIO SURGEONS STRL SZ 6.5 (GLOVE) ×2
GLOVE INDICATOR 6.5 STRL GRN (GLOVE) ×3 IMPLANT
GOWN STRL REUS W/ TWL LRG LVL3 (GOWN DISPOSABLE) ×2 IMPLANT
GOWN STRL REUS W/ TWL XL LVL3 (GOWN DISPOSABLE) ×1 IMPLANT
GOWN STRL REUS W/TWL LRG LVL3 (GOWN DISPOSABLE) ×4
GOWN STRL REUS W/TWL XL LVL3 (GOWN DISPOSABLE) ×2
IV NS IRRIG 3000ML ARTHROMATIC (IV SOLUTION) ×3 IMPLANT
KIT ROOM TURNOVER WOR (KITS) ×3 IMPLANT
LEGGING LITHOTOMY PAIR STRL (DRAPES) ×3 IMPLANT
LOOP ANGLED CUTTING 22FR (CUTTING LOOP) IMPLANT
PACK BASIN DAY SURGERY FS (CUSTOM PROCEDURE TRAY) ×3 IMPLANT
PAD OB MATERNITY 4.3X12.25 (PERSONAL CARE ITEMS) ×3 IMPLANT
PAD PREP 24X48 CUFFED NSTRL (MISCELLANEOUS) ×3 IMPLANT
SEAL ROD LENS SCOPE MYOSURE (ABLATOR) IMPLANT
TOWEL OR 17X24 6PK STRL BLUE (TOWEL DISPOSABLE) ×6 IMPLANT
TRAY DSU PREP LF (CUSTOM PROCEDURE TRAY) ×3 IMPLANT
WATER STERILE IRR 500ML POUR (IV SOLUTION) ×3 IMPLANT

## 2015-11-21 NOTE — Transfer of Care (Signed)
Immediate Anesthesia Transfer of Care Note  Patient: Alexis Arnold  Procedure(s) Performed: Procedure(s): DILATATION AND CURETTAGE /HYSTEROSCOPY POSSIBLE MYOSURE (POLYP) (N/A)  Patient Location: PACU  Anesthesia Type:General  Level of Consciousness: sedated and responds to stimulation  Airway & Oxygen Therapy: Patient Spontanous Breathing and Patient connected to nasal cannula oxygen  Post-op Assessment: Report given to RN  Post vital signs: Reviewed and stable  Last Vitals:  Filed Vitals:   11/21/15 0629 11/21/15 0800  BP: 108/70 104/57  Pulse: 74 79  Temp: 37 C   Resp: 16 7    Complications: No apparent anesthesia complications

## 2015-11-21 NOTE — Discharge Instructions (Signed)

## 2015-11-21 NOTE — Anesthesia Postprocedure Evaluation (Signed)
Anesthesia Post Note  Patient: Alexis Arnold  Procedure(s) Performed: Procedure(s) (LRB): DILATATION AND CURETTAGE /HYSTEROSCOPY  (N/A)  Patient location during evaluation: PACU Anesthesia Type: General Level of consciousness: awake and alert Pain management: pain level controlled Vital Signs Assessment: post-procedure vital signs reviewed and stable Respiratory status: spontaneous breathing, nonlabored ventilation, respiratory function stable and patient connected to nasal cannula oxygen Cardiovascular status: blood pressure returned to baseline and stable Postop Assessment: no signs of nausea or vomiting Anesthetic complications: no    Last Vitals:  Filed Vitals:   11/21/15 0830 11/21/15 0845  BP: 93/60 98/67  Pulse: 62 68  Temp:    Resp: 12 18    Last Pain:  Filed Vitals:   11/21/15 0904  PainSc: 0-No pain                 Effie Berkshire

## 2015-11-21 NOTE — Anesthesia Procedure Notes (Signed)
Procedure Name: LMA Insertion Date/Time: 11/21/2015 7:29 AM Performed by: Bethena Roys T Pre-anesthesia Checklist: Patient identified, Emergency Drugs available, Suction available and Patient being monitored Patient Re-evaluated:Patient Re-evaluated prior to inductionOxygen Delivery Method: Circle System Utilized Preoxygenation: Pre-oxygenation with 100% oxygen Intubation Type: IV induction Ventilation: Mask ventilation without difficulty LMA: LMA inserted LMA Size: 4.0 Number of attempts: 1 Airway Equipment and Method: Bite block Placement Confirmation: positive ETCO2 Dental Injury: Teeth and Oropharynx as per pre-operative assessment

## 2015-11-24 ENCOUNTER — Encounter (HOSPITAL_BASED_OUTPATIENT_CLINIC_OR_DEPARTMENT_OTHER): Payer: Self-pay | Admitting: Obstetrics and Gynecology

## 2015-11-24 NOTE — Op Note (Signed)
NAME:  Alexis Arnold, Alexis Arnold NO.:  MEDICAL RECORD NO.:  [id number]  LOCATION:                                 FACILITY:  PHYSICIAN:  Marylynn Pearson, MD         DATE OF BIRTH:  DATE OF PROCEDURE: DATE OF DISCHARGE:                              OPERATIVE REPORT   PREOPERATIVE DIAGNOSIS:  Postmenopausal bleeding.  POSTOPERATIVE DIAGNOSIS:  Postmenopausal bleeding.  PROCEDURE:  Hysteroscopy, D and C with paracervical block.  SURGEON:  Marylynn Pearson, MD  ANESTHESIA:  General with local.  SPECIMENS:  Endometrial curettings.  FLUID DEFICIT:  94 mL.  ESTIMATED BLOOD LOSS:  10 mL.  COMPLICATIONS:  None.  CONDITION:  Stable to recovery room.  DESCRIPTION OF PROCEDURE:  The patient was taken to the operating room after informed consent was obtained.  She was given anesthesia, placed in the dorsal lithotomy position using Allen stirrups.  Prepped and draped in sterile fashion.  An in and out catheter was used to drain her bladder for an unmeasured amount urine.  Bivalve speculum was placed in the vagina and 1 mL of 1% lidocaine was used to provide local anesthesia to the anterior lip of the cervix.  The remaining 9 mL was used to perform a paracervical block.  The cervix was serially dilated using Pratt dilators and the hysteroscope was inserted.  No masses or abnormalities were noted throughout.  She did have some mild thickening of the endometrium on the anterior wall just beyond the internal cervical os.  Hysteroscope was removed and a gentle curetting was performed throughout.  Specimen was placed on Telfa and passed off to be sent to Pathology.  Tenaculum was removed and the cervix was hemostatic.  Speculum was removed.  She was extubated and taken to the recovery room in stable condition.  Sponge, lap, needle, and instrument counts were correct x2.     Marylynn Pearson, MD     GA/MEDQ  D:  11/21/2015  T:  11/21/2015  Job:  [id number]

## 2016-04-11 ENCOUNTER — Emergency Department (HOSPITAL_BASED_OUTPATIENT_CLINIC_OR_DEPARTMENT_OTHER)

## 2016-04-11 ENCOUNTER — Encounter (HOSPITAL_BASED_OUTPATIENT_CLINIC_OR_DEPARTMENT_OTHER): Payer: Self-pay | Admitting: *Deleted

## 2016-04-11 ENCOUNTER — Emergency Department (HOSPITAL_BASED_OUTPATIENT_CLINIC_OR_DEPARTMENT_OTHER)
Admission: EM | Admit: 2016-04-11 | Discharge: 2016-04-11 | Disposition: A | Discharge status: Home / Self Care | Attending: Physician Assistant | Admitting: Physician Assistant

## 2016-04-11 DIAGNOSIS — I8001 Phlebitis and thrombophlebitis of superficial vessels of right lower extremity: Secondary | ICD-10-CM | POA: Insufficient documentation

## 2016-04-11 DIAGNOSIS — E039 Hypothyroidism, unspecified: Secondary | ICD-10-CM | POA: Diagnosis not present

## 2016-04-11 DIAGNOSIS — Z87891 Personal history of nicotine dependence: Secondary | ICD-10-CM | POA: Insufficient documentation

## 2016-04-11 DIAGNOSIS — Z79899 Other long term (current) drug therapy: Secondary | ICD-10-CM | POA: Insufficient documentation

## 2016-04-11 DIAGNOSIS — M79604 Pain in right leg: Secondary | ICD-10-CM | POA: Diagnosis present

## 2016-04-11 DIAGNOSIS — I809 Phlebitis and thrombophlebitis of unspecified site: Secondary | ICD-10-CM

## 2016-04-11 DIAGNOSIS — Z791 Long term (current) use of non-steroidal anti-inflammatories (NSAID): Secondary | ICD-10-CM | POA: Diagnosis not present

## 2016-04-11 NOTE — ED Triage Notes (Signed)
Patient states she developed some redness in the right lower foot 1.5 weeks ago, now the redness is radiating up the right leg to the knee.  History of DVT, has been traveling long distances over the last two weeks.

## 2016-04-11 NOTE — ED Provider Notes (Signed)
[facility] DEPT [facility] Provider Note   CSN: [id number] Arrival date & time: 04/11/16  1548 By signing my name below, I, Dyke Brackett, attest that this documentation has been prepared under the direction and in the presence of non-physician practitioner, Montine Circle, PA-C  Electronically Signed: Dyke Brackett, Scribe. 04/11/2016. 4:54 PM.   History   Chief Complaint Chief Complaint  Patient presents with  . Leg Pain   HPI Alexis Arnold is a 55 y.o. female with hx of DVT who presents to the Emergency Department complaining of worsning redness to her right lower foot onset two weeks ago. Pt states the redness has now moved to the medial side of her leg up to the knee. Pt notes associated swelling. She states she has been traveling long distances recently and is currently taking estrogen. Pt denies CP, SOB, numbness and tingling.  The history is provided by the patient. No language interpreter was used.    Past Medical History:  Diagnosis Date  . Endometrial mass   . History of adenomatous polyp of colon    tubular adenoma 10-31-2013  . History of DVT of lower extremity    right lower extremity 07-30-2008  . Hypothyroidism   . PMB (postmenopausal bleeding)     Patient Active Problem List   Diagnosis Date Noted  . Cellulitis of forearm, right 03/08/2014  . Cat bite 03/08/2014  . Hypothyroid 03/08/2014    Past Surgical History:  Procedure Laterality Date  . CATARACT EXTRACTION W/ INTRAOCULAR LENS  IMPLANT, BILATERAL  2016  . CESAREAN SECTION  1990  . COLONOSCOPY  10-31-2013  . COMBINED REDUCTION MAMMAPLASTY W/ ABDOMINOPLASTY  2014  . HYSTEROSCOPY W/D&C N/A 11/21/2015   Procedure: DILATATION AND CURETTAGE /HYSTEROSCOPY ;  Surgeon: Marylynn Pearson, MD;  Location: [facility];  Service: Gynecology;  Laterality: N/A;  . I&D EXTREMITY Right 03/08/2014   Procedure: MINOR IRRIGATION AND DEBRIDEMENT EXTREMITY;  Surgeon: Mcarthur Rossetti, MD;  Location: [facility];  Service: Orthopedics;  Laterality: Right;  . INCISION AND DRAINAGE Right    right arm cat bite.  Marland Kitchen ROUX-EN-Y GASTRIC BYPASS  2004  . SHOULDER ARTHROSCOPY Right 2001    OB History    No data available      Home Medications    Prior to Admission medications   Medication Sig Start Date End Date Taking? Authorizing Provider  cyanocobalamin (,VITAMIN B-12,) 1000 MCG/ML injection Inject 1,000 mcg into the muscle every 30 (thirty) days.    Yes Historical Provider, MD  estrogens, conjugated, (PREMARIN) 1.25 MG tablet Take 1.25 mg by mouth every evening.    Yes Historical Provider, MD  ibuprofen (ADVIL,MOTRIN) 800 MG tablet Take 1 tablet (800 mg total) by mouth every 6 (six) hours as needed for moderate pain. 11/21/15  Yes Marylynn Pearson, MD  levothyroxine (SYNTHROID, LEVOTHROID) 150 MCG tablet Take 150 mcg by mouth every evening.    Yes Historical Provider, MD  Multiple Vitamin (MULTIVITAMIN) tablet Take 1 tablet by mouth daily.   Yes Historical Provider, MD  progesterone (PROMETRIUM) 100 MG capsule Take 100 mg by mouth every evening.   Yes Historical Provider, MD    Family History Family History  Problem Relation Age of Onset  . Hypertension Brother   . Colon cancer Neg Hx     Social History Social History  Substance Use Topics  . Smoking status: Former Smoker    Years: 14.00    Types: Cigarettes    Quit date: 11/17/1998  . Smokeless tobacco: Never  Used  . Alcohol use Yes     Comment: occasionally     Allergies   Codeine and Penicillins  Review of Systems Review of Systems  Respiratory: Negative for shortness of breath.   Cardiovascular: Positive for leg swelling. Negative for chest pain.  Skin: Positive for color change.  Neurological: Negative for numbness.  All other systems reviewed and are negative.  Physical Exam Updated Vital Signs BP 143/84 (BP Location: Right Arm)   Pulse 78   Temp 98.7 F (37.1 C) (Oral)   Resp 18   Ht 5\' 6"  (1.676 m)   Wt 155 lb  (70.3 kg)   LMP 03/05/2014   SpO2 100%   BMI 25.02 kg/m   Physical Exam  Physical Exam  Constitutional: Pt appears well-developed and well-nourished. No distress.  HENT:  Head: Normocephalic and atraumatic.  Eyes: Conjunctivae are normal.  Neck: Normal range of motion.  Cardiovascular: Normal rate, regular rhythm and intact distal pulses.   Capillary refill < 3 sec  Pulmonary/Chest: Effort normal and breath sounds normal.  Musculoskeletal: Pt exhibits tenderness to the saphenous vein distribution. Pt exhibits mild edema.  ROM: 5/5  Neurological: Pt  is alert. Coordination normal.  Sensation 5/5 Strength 5/5  Skin: Skin is warm and dry. Pt is not diaphoretic.  No evidence of cellulitis  Psychiatric: Pt has a normal mood and affect.  Nursing note and vitals reviewed.   ED Treatments / Results  DIAGNOSTIC STUDIES:  Oxygen Saturation is 100% on RA, normal by my interpretation.    COORDINATION OF CARE:  4:50 PM Will order US venous Img lower unilateral right. Discussed treatment plan with pt at bedside and pt agreed to plan.   Labs (all labs ordered are listed, but only abnormal results are displayed) Labs Reviewed - No data to display  EKG  EKG Interpretation None       Radiology No results found.  Procedures Procedures (including critical care time)  Medications Ordered in ED Medications - No data to display   Initial Impression / Assessment and Plan / ED Course  I have reviewed the triage vital signs and the nursing notes.  Pertinent labs & imaging results that were available during my care of the patient were reviewed by me and considered in my medical decision making (see chart for details).  Clinical Course    Patient with PMH of dvt, here with leg swelling and calf pain.  DVT study remarkable for superficial thrombophlebitis.  No DVT.  Discussed with Dr. Thomasene Lot, who agrees with the plan.  Warm compresses and close observation.  No CP or  SOB.  Final Clinical Impressions(s) / ED Diagnoses   Final diagnoses:  Superficial thrombophlebitis    New Prescriptions New Prescriptions   No medications on file  I personally performed the services described in this documentation, which was scribed in my presence. The recorded information has been reviewed and is accurate.      Montine Circle, PA-C 04/11/16 1809    Courteney Julio Alm, MD 04/12/16 [id number]

## 2016-04-11 NOTE — Discharge Instructions (Signed)
Please continue using NSAIDs and warm compresses.  Please monitor your symptoms closely and return if your symptoms worsen.  Please return if you experience any shortness of breath, chest pain, or fever.

## 2016-08-28 ENCOUNTER — Encounter (HOSPITAL_BASED_OUTPATIENT_CLINIC_OR_DEPARTMENT_OTHER): Payer: Self-pay

## 2016-08-28 ENCOUNTER — Emergency Department (HOSPITAL_BASED_OUTPATIENT_CLINIC_OR_DEPARTMENT_OTHER)
Admission: EM | Admit: 2016-08-28 | Discharge: 2016-08-28 | Disposition: A | Discharge status: Home / Self Care | Attending: Emergency Medicine | Admitting: Emergency Medicine

## 2016-08-28 ENCOUNTER — Emergency Department (HOSPITAL_BASED_OUTPATIENT_CLINIC_OR_DEPARTMENT_OTHER)

## 2016-08-28 DIAGNOSIS — W19XXXA Unspecified fall, initial encounter: Secondary | ICD-10-CM | POA: Diagnosis not present

## 2016-08-28 DIAGNOSIS — Y939 Activity, unspecified: Secondary | ICD-10-CM | POA: Diagnosis not present

## 2016-08-28 DIAGNOSIS — E039 Hypothyroidism, unspecified: Secondary | ICD-10-CM | POA: Diagnosis not present

## 2016-08-28 DIAGNOSIS — W548XXA Other contact with dog, initial encounter: Secondary | ICD-10-CM

## 2016-08-28 DIAGNOSIS — S82431A Displaced oblique fracture of shaft of right fibula, initial encounter for closed fracture: Secondary | ICD-10-CM | POA: Insufficient documentation

## 2016-08-28 DIAGNOSIS — Y929 Unspecified place or not applicable: Secondary | ICD-10-CM | POA: Insufficient documentation

## 2016-08-28 DIAGNOSIS — Y999 Unspecified external cause status: Secondary | ICD-10-CM | POA: Insufficient documentation

## 2016-08-28 DIAGNOSIS — Z87891 Personal history of nicotine dependence: Secondary | ICD-10-CM | POA: Diagnosis not present

## 2016-08-28 DIAGNOSIS — S99911A Unspecified injury of right ankle, initial encounter: Secondary | ICD-10-CM | POA: Diagnosis present

## 2016-08-28 DIAGNOSIS — S82891A Other fracture of right lower leg, initial encounter for closed fracture: Secondary | ICD-10-CM

## 2016-08-28 MED ORDER — IBUPROFEN 800 MG PO TABS
800.0000 mg | ORAL_TABLET | Freq: Once | ORAL | Status: AC
  Administered 2016-08-28: 800 mg via ORAL
  Filled 2016-08-28: qty 1

## 2016-08-28 MED ORDER — IBUPROFEN 800 MG PO TABS: 800.0000 mg | ORAL_TABLET | Freq: Three times a day (TID) | ORAL | 0 refills | Status: DC

## 2016-08-28 NOTE — ED Provider Notes (Signed)
[facility] Provider Note: Georgena Spurling, MD, FACEP  CSN: [id number] MRN: [id number] ARRIVAL: 08/28/16 at [place]: [place]  Ankle Injury   HISTORY OF PRESENT ILLNESS  Alexis Arnold is a 56 y.o. female who was pulled down by her dog about an hour prior to arrival. She has pain, ecchymosis and swelling over the lateral aspect of her right ankle. Pain is minimal at rest but moderate with attempted weightbearing. There is no numbness or weakness of the toes of that foot. She denies other injury. She was treated with an ice pack by nursing staff.   Past Medical History:  Diagnosis Date  . Endometrial mass   . History of adenomatous polyp of colon    tubular adenoma 10-31-2013  . History of DVT of lower extremity    right lower extremity 07-30-2008  . Hypothyroidism   . PMB (postmenopausal bleeding)     Past Surgical History:  Procedure Laterality Date  . CATARACT EXTRACTION W/ INTRAOCULAR LENS  IMPLANT, BILATERAL  2016  . CESAREAN SECTION  1990  . COLONOSCOPY  10-31-2013  . COMBINED REDUCTION MAMMAPLASTY W/ ABDOMINOPLASTY  2014  . HYSTEROSCOPY W/D&C N/A 11/21/2015   Procedure: DILATATION AND CURETTAGE /HYSTEROSCOPY ;  Surgeon: Marylynn Pearson, MD;  Location: [facility];  Service: Gynecology;  Laterality: N/A;  . I&D EXTREMITY Right 03/08/2014   Procedure: MINOR IRRIGATION AND DEBRIDEMENT EXTREMITY;  Surgeon: Mcarthur Rossetti, MD;  Location: [facility];  Service: Orthopedics;  Laterality: Right;  . INCISION AND DRAINAGE Right    right arm cat bite.  Marland Kitchen ROUX-EN-Y GASTRIC BYPASS  2004  . SHOULDER ARTHROSCOPY Right 2001    Family History  Problem Relation Age of Onset  . Hypertension Brother   . Colon cancer Neg Hx     Social History  Substance Use Topics  . Smoking status: Former Smoker    Years: 14.00    Types: Cigarettes    Quit date: 11/17/1998  . Smokeless tobacco: Never Used  . Alcohol use Yes     Comment:  occasionally    Prior to Admission medications   Medication Sig Start Date End Date Taking? Authorizing Provider  cyanocobalamin (,VITAMIN B-12,) 1000 MCG/ML injection Inject 1,000 mcg into the muscle every 30 (thirty) days.     Historical Provider, MD  estrogens, conjugated, (PREMARIN) 1.25 MG tablet Take 1.25 mg by mouth every evening.     Historical Provider, MD  ibuprofen (ADVIL,MOTRIN) 800 MG tablet Take 1 tablet (800 mg total) by mouth every 6 (six) hours as needed for moderate pain. 11/21/15   Marylynn Pearson, MD  levothyroxine (SYNTHROID, LEVOTHROID) 150 MCG tablet Take 150 mcg by mouth every evening.     Historical Provider, MD  Multiple Vitamin (MULTIVITAMIN) tablet Take 1 tablet by mouth daily.    Historical Provider, MD  progesterone (PROMETRIUM) 100 MG capsule Take 100 mg by mouth every evening.    Historical Provider, MD    Allergies Codeine and Penicillins   REVIEW OF SYSTEMS  Negative except as noted here or in the History of Present Illness.   PHYSICAL EXAMINATION  Initial Vital Signs Blood pressure 112/74, pulse 71, temperature 97.9 F (36.6 C), temperature source Oral, resp. rate 17, height 5\' 6"  (1.676 m), weight 145 lb (65.8 kg), last menstrual period 03/05/2014, SpO2 100 %.  Examination General: Well-developed, well-nourished female in no acute distress; appearance consistent with age of record HENT: normocephalic; atraumatic Eyes: Normal appearance Neck: supple Heart: regular  rate and rhythm Lungs: clear to auscultation bilaterally Abdomen: soft; nondistended Extremities: No deformity; full range of motion except right ankle; pulses normal; tenderness, ecchymosis and swelling over right lateral malleolus, right foot distally neurovascularly intact with intact tendon function, no proximal fibular tenderness Neurologic: Awake, alert and oriented; motor function intact in all extremities and symmetric; no facial droop Skin: Warm and dry Psychiatric: Normal mood  and affect   RESULTS  Summary of this visit's results, reviewed by myself:   EKG Interpretation  Date/Time:    Ventricular Rate:    PR Interval:    QRS Duration:   QT Interval:    QTC Calculation:   R Axis:     Text Interpretation:        Laboratory Studies: No results found for this or any previous visit (from the past 24 hour(s)). Imaging Studies: Dg Ankle Complete Right  Result Date: 08/28/2016 CLINICAL DATA:  Right ankle pain after tripping over a dog. EXAM: RIGHT ANKLE - COMPLETE 3+ VIEW COMPARISON:  None. FINDINGS: Bimalleolar fractures of the ankle are noted with an oblique component through the distal fibula and transverse component through the tip of the medial malleolus. Intra-articular extension is noted into the ankle joint. Joint spaces are intact without abnormal widening. Soft tissue swelling is seen about the malleoli. IMPRESSION: Acute, closed, oblique intra-articular fracture of the distal fibula. Acute, transverse fracture without significant displacement of the medial malleolus. Overlying soft tissue swelling is noted. Joint spaces are maintained. Electronically Signed   By: Ashley Royalty M.D.   On: 08/28/2016 23:11    ED COURSE  Nursing notes and initial vitals signs, including pulse oximetry, reviewed.  Vitals:   08/28/16 2238  BP: 112/74  Pulse: 71  Resp: 17  Temp: 97.9 F (36.6 C)  TempSrc: Oral  SpO2: 100%  Weight: 145 lb (65.8 kg)  Height: 5\' 6"  (1.676 m)    PROCEDURES    ED DIAGNOSES     ICD-9-CM ICD-10-CM   1. Closed fracture of right ankle, initial encounter 824.8 S82.891A   2. Other contact with dog, initial encounter E906.8 W54.8XXA        Shanon Rosser, MD 08/28/16 [phone]

## 2016-08-28 NOTE — ED Triage Notes (Signed)
Pt reports falling on right ankle while walking dog tonight.  Tenderness and swelling noted to right ankle.

## 2016-09-01 ENCOUNTER — Ambulatory Visit (INDEPENDENT_AMBULATORY_CARE_PROVIDER_SITE_OTHER): Admitting: Orthopaedic Surgery

## 2016-09-01 DIAGNOSIS — S8261XA Displaced fracture of lateral malleolus of right fibula, initial encounter for closed fracture: Secondary | ICD-10-CM | POA: Diagnosis not present

## 2016-09-01 NOTE — Progress Notes (Signed)
Office Visit Note   Patient: Alexis Arnold           Date of Birth: 1960/12/05           MRN: [id number] Visit Date: 09/01/2016              Requested by: Leanna Battles, MD [address] [place], [place] [address] PCP: Donnajean Lopes, MD   Assessment & Plan: Visit Diagnoses:  1. Displaced fracture of lateral malleolus of right fibula, initial encounter for closed fracture     Plan: This is a stable fracture the week can treat in a Cam Walker with weightbearing as tolerated. I will like to see her back in 2 weeks with a repeat 3 views of her right ankle. 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 I expect her to have full recovery and be able to get back to [employer] training and exercises after a short recovery time  Follow-Up Instructions: Return in about 2 weeks (around 09/15/2016).   Orders:  No orders of the defined types were placed in this encounter.  No orders of the defined types were placed in this encounter.     Procedures: No procedures performed   Clinical Data: No additional findings.   Subjective: No chief complaint on file. 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 She felt a "pop". She was seen in emergency room and placed appropriate in a splint. This her first follow-up since then. This is her only injury from this accident.  HPI  Review of Systems Denies any chest pain, shortness of breath, headache, fever, chills, nausea, vomiting.  Objective: Vital Signs: LMP 03/05/2014   Physical Exam She is alert and oriented in no acute distress Ortho Exam She has significant swelling and bruising around her right ankle. The ankle is clinically well located. She's neurovascularly intact. Her pain is only over the lateral malleolus. There  is no pain over the medial malleolus. Specialty Comments:  No specialty comments available.  Imaging: No results found. X-rays of her ankle on the right side that independently reviewed by me show a lateral malleolus fracture that is nondisplaced. The ankle mortise is intact. The small avulsion at the tip of the medial malleolus.  PMFS History: Patient Active Problem List   Diagnosis Date Noted  . Displaced fracture of lateral malleolus of right fibula, initial encounter for closed fracture 09/01/2016  . Cellulitis of forearm, right 03/08/2014  . Cat bite 03/08/2014  . Hypothyroid 03/08/2014   Past Medical History:  Diagnosis Date  . Endometrial mass   . History of adenomatous polyp of colon    tubular adenoma 10-31-2013  . History of DVT of lower extremity    right lower extremity 07-30-2008  . Hypothyroidism   . PMB (postmenopausal bleeding)     Family History  Problem Relation Age of Onset  . Hypertension Brother   . Colon cancer Neg Hx     Past Surgical History:  Procedure Laterality Date  . CATARACT EXTRACTION W/ INTRAOCULAR LENS  IMPLANT, BILATERAL  2016  . CESAREAN SECTION  1990  . COLONOSCOPY  10-31-2013  . COMBINED REDUCTION MAMMAPLASTY W/ ABDOMINOPLASTY  2014  . HYSTEROSCOPY W/D&C N/A 11/21/2015   Procedure: DILATATION AND CURETTAGE /HYSTEROSCOPY ;  Surgeon: Marylynn Pearson, MD;  Location: [facility];  Service: Gynecology;  Laterality: N/A;  . I&D EXTREMITY Right 03/08/2014   Procedure: MINOR IRRIGATION AND DEBRIDEMENT EXTREMITY;  Surgeon: Mcarthur Rossetti, MD;  Location: [facility];  Service: Orthopedics;  Laterality: Right;  . INCISION AND DRAINAGE Right    right arm cat bite.  Marland Kitchen ROUX-EN-Y GASTRIC BYPASS  2004  . SHOULDER ARTHROSCOPY Right 2001   Social History   Occupational History  . Not on file.   Social History Main Topics  . Smoking status: Former Smoker    Years: 14.00    Types: Cigarettes    Quit date: 11/17/1998  . Smokeless  tobacco: Never Used  . Alcohol use Yes     Comment: occasionally  . Drug use: No  . Sexual activity: Yes

## 2016-09-15 ENCOUNTER — Ambulatory Visit (INDEPENDENT_AMBULATORY_CARE_PROVIDER_SITE_OTHER)

## 2016-09-15 ENCOUNTER — Ambulatory Visit (INDEPENDENT_AMBULATORY_CARE_PROVIDER_SITE_OTHER): Payer: Self-pay | Admitting: Orthopaedic Surgery

## 2016-09-15 ENCOUNTER — Encounter (INDEPENDENT_AMBULATORY_CARE_PROVIDER_SITE_OTHER): Payer: Self-pay | Admitting: Orthopaedic Surgery

## 2016-09-15 DIAGNOSIS — S8261XD Displaced fracture of lateral malleolus of right fibula, subsequent encounter for closed fracture with routine healing: Secondary | ICD-10-CM

## 2016-09-15 DIAGNOSIS — S8261XA Displaced fracture of lateral malleolus of right fibula, initial encounter for closed fracture: Secondary | ICD-10-CM

## 2016-09-15 NOTE — Progress Notes (Signed)
Alexis Arnold is now just over 2 weeks status post a right ankle injury which she sustained a nondisplaced fibula fracture she's been weightbearing as tolerated in a cam walker and doing well. She does take ibuprofen for pain and inflammation.  On examination she has swelling to be expected and pain over the lateral malleolus of her right ankle but she is neurovascularly intact with good range of motion. Swelling is diminished from her last visit. 3 views of her right ankle are obtained and show the ankle mortise is well located and the fractures well aligned with slight interval healing.  At this point the next 2 weeks she'll transition to an ASO. She'll continue increase her activities but will still avoid high-impact aerobic activities. We'll see her back in a month with a final repeat 3 views of her right ankle.

## 2016-09-22 ENCOUNTER — Other Ambulatory Visit (HOSPITAL_COMMUNITY): Payer: Self-pay | Admitting: *Deleted

## 2016-09-23 ENCOUNTER — Ambulatory Visit (HOSPITAL_COMMUNITY)
Admission: RE | Admit: 2016-09-23 | Discharge: 2016-09-23 | Disposition: A | Discharge status: Home / Self Care | Source: Ambulatory Visit | Attending: Internal Medicine | Admitting: Internal Medicine

## 2016-09-23 DIAGNOSIS — D649 Anemia, unspecified: Secondary | ICD-10-CM | POA: Insufficient documentation

## 2016-09-23 MED ORDER — SODIUM CHLORIDE 0.9 % IV SOLN
510.0000 mg | INTRAVENOUS | Status: DC
  Administered 2016-09-23: 13:00:00 510 mg via INTRAVENOUS
  Filled 2016-09-23: qty 17

## 2016-09-28 ENCOUNTER — Other Ambulatory Visit (HOSPITAL_COMMUNITY): Payer: Self-pay | Admitting: *Deleted

## 2016-09-29 ENCOUNTER — Ambulatory Visit (HOSPITAL_COMMUNITY)
Admission: RE | Admit: 2016-09-29 | Discharge: 2016-09-29 | Disposition: A | Discharge status: Home / Self Care | Source: Ambulatory Visit | Attending: Internal Medicine | Admitting: Internal Medicine

## 2016-09-29 DIAGNOSIS — D649 Anemia, unspecified: Secondary | ICD-10-CM | POA: Insufficient documentation

## 2016-09-29 MED ORDER — SODIUM CHLORIDE 0.9 % IV SOLN
510.0000 mg | INTRAVENOUS | Status: AC
  Administered 2016-09-29: 13:00:00 510 mg via INTRAVENOUS
  Filled 2016-09-29: qty 17

## 2016-10-13 ENCOUNTER — Ambulatory Visit (INDEPENDENT_AMBULATORY_CARE_PROVIDER_SITE_OTHER): Payer: Self-pay | Admitting: Orthopaedic Surgery

## 2016-10-13 ENCOUNTER — Ambulatory Visit (INDEPENDENT_AMBULATORY_CARE_PROVIDER_SITE_OTHER)

## 2016-10-13 DIAGNOSIS — M25571 Pain in right ankle and joints of right foot: Secondary | ICD-10-CM

## 2016-10-13 DIAGNOSIS — S8261XA Displaced fracture of lateral malleolus of right fibula, initial encounter for closed fracture: Secondary | ICD-10-CM

## 2016-10-13 NOTE — Progress Notes (Signed)
Alexis Arnold comes in today 7 weeks out from a bimalleolar equivalent ankle fracture. This was nondisplaced. She is in a ASO and doing well. She has occasional swelling and pain in her ankle but no significant issues. On examination of her ankle she has excellent range of motion of her right ankle with minimal pain.  X-rays of her ankle are obtained and show interval healing of the lateral malleolus fracture as well as the tip of the medial malleolus and the ankle mortise is well located.  At this point she'll work on range of motion strengthening exercises. She can transition out of ASO she's comfort. She'll avoid high-impact aerobic activities for at least 3 more weeks. She can follow up as needed at this point.

## 2017-03-11 ENCOUNTER — Inpatient Hospital Stay (HOSPITAL_COMMUNITY)
Admission: AD | Admit: 2017-03-11 | Discharge: 2017-03-11 | Disposition: A | Discharge status: Home / Self Care | Source: Ambulatory Visit | Attending: Obstetrics and Gynecology | Admitting: Obstetrics and Gynecology

## 2017-03-11 DIAGNOSIS — A64 Unspecified sexually transmitted disease: Secondary | ICD-10-CM | POA: Insufficient documentation

## 2017-03-11 MED ORDER — CEFTRIAXONE SODIUM 250 MG IJ SOLR
250.0000 mg | Freq: Once | INTRAMUSCULAR | Status: AC
  Administered 2017-03-11: 250 mg via INTRAMUSCULAR
  Filled 2017-03-11: qty 250

## 2017-03-11 MED ORDER — GENTAMICIN SULFATE 40 MG/ML IJ SOLN: 240.0000 mg | Freq: Once | INTRAMUSCULAR | Status: DC

## 2017-11-28 ENCOUNTER — Encounter: Payer: Self-pay | Admitting: *Deleted

## 2017-12-13 ENCOUNTER — Emergency Department (HOSPITAL_COMMUNITY)

## 2017-12-13 ENCOUNTER — Emergency Department (HOSPITAL_COMMUNITY)
Admission: EM | Admit: 2017-12-13 | Discharge: 2017-12-13 | Disposition: A | Discharge status: Home / Self Care | Attending: Emergency Medicine | Admitting: Emergency Medicine

## 2017-12-13 ENCOUNTER — Encounter (HOSPITAL_COMMUNITY): Payer: Self-pay

## 2017-12-13 DIAGNOSIS — Y929 Unspecified place or not applicable: Secondary | ICD-10-CM | POA: Insufficient documentation

## 2017-12-13 DIAGNOSIS — S82851A Displaced trimalleolar fracture of right lower leg, initial encounter for closed fracture: Secondary | ICD-10-CM | POA: Diagnosis not present

## 2017-12-13 DIAGNOSIS — S99911A Unspecified injury of right ankle, initial encounter: Secondary | ICD-10-CM | POA: Diagnosis present

## 2017-12-13 DIAGNOSIS — Y999 Unspecified external cause status: Secondary | ICD-10-CM | POA: Insufficient documentation

## 2017-12-13 DIAGNOSIS — Y9389 Activity, other specified: Secondary | ICD-10-CM | POA: Diagnosis not present

## 2017-12-13 DIAGNOSIS — W010XXA Fall on same level from slipping, tripping and stumbling without subsequent striking against object, initial encounter: Secondary | ICD-10-CM | POA: Insufficient documentation

## 2017-12-13 DIAGNOSIS — Z87891 Personal history of nicotine dependence: Secondary | ICD-10-CM | POA: Diagnosis not present

## 2017-12-13 DIAGNOSIS — Z79899 Other long term (current) drug therapy: Secondary | ICD-10-CM | POA: Insufficient documentation

## 2017-12-13 DIAGNOSIS — E039 Hypothyroidism, unspecified: Secondary | ICD-10-CM | POA: Diagnosis not present

## 2017-12-13 MED ORDER — HYDROCODONE-ACETAMINOPHEN 5-325 MG PO TABS: 1.0000 | ORAL_TABLET | Freq: Four times a day (QID) | ORAL | 0 refills | Status: DC | PRN

## 2017-12-13 MED ORDER — KETAMINE HCL 50 MG/5ML IJ SOSY
1.0000 mg/kg | PREFILLED_SYRINGE | Freq: Once | INTRAMUSCULAR | Status: AC
  Administered 2017-12-13: 71 mg via INTRAVENOUS
  Filled 2017-12-13: qty 10

## 2017-12-13 MED ORDER — ACETAMINOPHEN ER 650 MG PO TBCR: 650.0000 mg | EXTENDED_RELEASE_TABLET | Freq: Three times a day (TID) | ORAL | 0 refills | Status: DC | PRN

## 2017-12-13 NOTE — Discharge Instructions (Addendum)
We saw you in the ER for the unstable ankle fracture. Your ankle fracture was stabilized in the ER with reduction and splinting. You will need to call [facility] tomorrow to set up an appointment for your elective surgery. Take the medicines prescribed for pain. Return to the ER immediately if you start having worsening of your pain in the legs or you have numbness or tingling. Keep the leg elevated.

## 2017-12-13 NOTE — ED Notes (Signed)
Bed: BJ47 Expected date:  Expected time:  Means of arrival:  Comments: EMS 57 yo female from home/fell walking dog and twisted ankle

## 2017-12-13 NOTE — Progress Notes (Signed)
Orthopedic Tech Progress Note Patient Details:  Alexis Arnold 11/01/1960 [id number]  Ortho Devices Type of Ortho Device: Post (short leg) splint, Stirrup splint Ortho Device/Splint Location: rle Ortho Device/Splint Interventions: Ordered, Application, Adjustment   Post Interventions Patient Tolerated: Well Instructions Provided: Care of device   Alexis Arnold 12/13/2017, 2:13 AM

## 2017-12-13 NOTE — ED Notes (Signed)
Attempted multiple times to assist pt to the restroom. Pt refused, sat on the rolling stool in the room and rolled to the restroom. Offer assistance x4, pt stated "I do not need assistance." and stated her job title.

## 2017-12-13 NOTE — ED Triage Notes (Signed)
Patient arrives by [facility]-was walking her dog and the dog pulled her down and injured her right ankle-c/o severe pain and swelling.

## 2017-12-13 NOTE — ED Notes (Signed)
Pt transported to xray 

## 2017-12-14 ENCOUNTER — Encounter (INDEPENDENT_AMBULATORY_CARE_PROVIDER_SITE_OTHER): Payer: Self-pay | Admitting: Orthopaedic Surgery

## 2017-12-14 ENCOUNTER — Ambulatory Visit (INDEPENDENT_AMBULATORY_CARE_PROVIDER_SITE_OTHER): Admitting: Orthopaedic Surgery

## 2017-12-14 ENCOUNTER — Other Ambulatory Visit (INDEPENDENT_AMBULATORY_CARE_PROVIDER_SITE_OTHER): Payer: Self-pay | Admitting: Physician Assistant

## 2017-12-14 DIAGNOSIS — S82852A Displaced trimalleolar fracture of left lower leg, initial encounter for closed fracture: Secondary | ICD-10-CM | POA: Diagnosis not present

## 2017-12-14 MED ORDER — HYDROCODONE-ACETAMINOPHEN 5-325 MG PO TABS: 1.0000 | ORAL_TABLET | Freq: Four times a day (QID) | ORAL | 0 refills | Status: DC | PRN

## 2017-12-14 NOTE — Progress Notes (Signed)
Office Visit Note   Patient: Alexis Arnold           Date of Birth: 04-13-1961           MRN: [id number] Visit Date: 12/14/2017              Requested by: Leanna Battles, MD [address] [place], [place] [address] PCP: Leanna Battles, MD   Assessment & Plan: Visit Diagnoses:  1. Trimalleolar fracture of ankle, closed, left, initial encounter     Plan: She understands fully this is going to require open reduction internal fixation with plates and screws on the fibula and plates in the medial side.  We will not need to likely address the posterior malleolus piece.  I talked about the risk and benefits of surgery and what this involves.  She will still stay off work for the next week and keep it elevated for soft tissue swelling to decrease.  We will set her up for surgery next week.  We will then see her back in 2 weeks postoperative with a repeat 3 views of the right ankle out of her splint.  All questions concerns were answered and addressed.  Follow-Up Instructions: Return for 2 weeks post-op.   Orders:  No orders of the defined types were placed in this encounter.  Meds ordered this encounter  Medications  . HYDROcodone-acetaminophen (NORCO/VICODIN) 5-325 MG tablet    Sig: Take 1 tablet by mouth every 6 (six) hours as needed for moderate pain.    Dispense:  15 tablet    Refill:  0      Procedures: No procedures performed   Clinical Data: No additional findings.   Subjective: Chief Complaint  Patient presents with  . Right Ankle - Fracture  The patient is well-known to me.  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  She sustained an unfortunate mechanical fall late Monday this week injuring her right ankle.  This is an ankle that she previously had a bimalleolar equivalent ankle fracture on a year ago.  She is done well from that fracture healed completely.  She then tripped with her dog Monday evening and injured his right ankle again.  This time  she sustained a displaced trimalleolar ankle fracture.  She was seen by the emergency room staff and they reduce the fracture and put her in a splint.  She is following up now with [place].  She is using a rolling knee scooter.  She is doing well overall.  HPI  Review of Systems She is not a diabetic.  She denies any recent illnesses.  Objective: Vital Signs: LMP 03/05/2014   Physical Exam She is alert and oriented x3 and in no acute distress Ortho Exam Examination of her right ankle shows she is in a well fitted splint.  I do not take the splint down.  Her ankle.  Straight.  Her foot at the end is well-perfused with normal sensation in her toes and she moves her toes well. Specialty Comments:  No specialty comments available.  Imaging: No results found. X-rays intimately reviewed of her right ankle do show a trimalleolar ankle fracture with significant displacement.  PMFS History: Patient Active Problem List   Diagnosis Date Noted  . Trimalleolar fracture of ankle, closed, left, initial encounter 12/14/2017  . Displaced fracture of lateral malleolus of right fibula, initial encounter for closed fracture 09/01/2016  . Cellulitis of forearm, right 03/08/2014  . Cat bite 03/08/2014  . Hypothyroid 03/08/2014  Past Medical History:  Diagnosis Date  . Endometrial mass   . History of adenomatous polyp of colon    tubular adenoma 10-31-2013  . History of DVT of lower extremity    right lower extremity 07-30-2008  . Hypothyroidism   . 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 PMB (postmenopausal bleeding)   . Tubular adenoma of colon     Family History  Problem Relation Age of Onset  . Hypertension Brother   . Depression Mother   . Mental illness Mother   . Cancer Mother   . Colon cancer Neg Hx     Past Surgical History:  Procedure Laterality Date  . CATARACT EXTRACTION W/ INTRAOCULAR LENS  IMPLANT, BILATERAL  2016  . CESAREAN SECTION  1990  .  COLONOSCOPY  10-31-2013  . COMBINED REDUCTION MAMMAPLASTY W/ ABDOMINOPLASTY  2014   with panniculectomy  . HYSTEROSCOPY W/D&C N/A 11/21/2015   Procedure: DILATATION AND CURETTAGE /HYSTEROSCOPY ;  Surgeon: Marylynn Pearson, MD;  Location: [facility];  Service: Gynecology;  Laterality: N/A;  . I&D EXTREMITY Right 03/08/2014   Procedure: MINOR IRRIGATION AND DEBRIDEMENT EXTREMITY;  Surgeon: Mcarthur Rossetti, MD;  Location: [facility];  Service: Orthopedics;  Laterality: Right;  . INCISION AND DRAINAGE Right    right arm cat bite.  Marland Kitchen ROUX-EN-Y GASTRIC BYPASS  2004  . SHOULDER ARTHROSCOPY Right 2001   Social History   Occupational History  . Not on file  Tobacco Use  . Smoking status: Former Smoker    Years: 14.00    Types: Cigarettes    Last attempt to quit: 11/17/1998    Years since quitting: 19.0  . Smokeless tobacco: Never Used  Substance and Sexual Activity  . Alcohol use: Yes    Comment: occasionally 2-4 times per month  . Drug use: No  . Sexual activity: Yes

## 2017-12-14 NOTE — ED Provider Notes (Signed)
[facility] DEPT Provider Note   CSN: [id number] Arrival date & time: 12/13/17  0008     History   Chief Complaint Chief Complaint  Patient presents with  . Right ankle injury    HPI Alexis Arnold is a 57 y.o. female.  HPI  57 year old female without significant medical history comes in with chief complaint of ankle pain. Patient had a mechanical fall earlier in the night when her dog overpowered her, resulting in a fall.  Patient had immediate pain and heard a pop.  She tried to get up but noticed that her ankle was unstable.  She works as an emergency medicine PA, and immediately knew that she had a trimalleolar fracture and call 911. Patient denies any numbness or tingling.  She is not on any blood thinners and denies pain elsewhere.  She has had by malleolar fracture on the same side which was treated by Dr. Ninfa Linden.  She had a glass of wine around 9 PM and her last meal was around 7 PM.  She does not have any history of severe reaction to anesthesia.  Past Medical History:  Diagnosis Date  . Endometrial mass   . History of adenomatous polyp of colon    tubular adenoma 10-31-2013  . History of DVT of lower extremity    right lower extremity 07-30-2008  . Hypothyroidism   . 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 PMB (postmenopausal bleeding)   . Tubular adenoma of colon     Patient Active Problem List   Diagnosis Date Noted  . Displaced fracture of lateral malleolus of right fibula, initial encounter for closed fracture 09/01/2016  . Cellulitis of forearm, right 03/08/2014  . Cat bite 03/08/2014  . Hypothyroid 03/08/2014    Past Surgical History:  Procedure Laterality Date  . CATARACT EXTRACTION W/ INTRAOCULAR LENS  IMPLANT, BILATERAL  2016  . CESAREAN SECTION  1990  . COLONOSCOPY  10-31-2013  . COMBINED REDUCTION MAMMAPLASTY W/ ABDOMINOPLASTY  2014   with panniculectomy  . HYSTEROSCOPY W/D&C N/A  11/21/2015   Procedure: DILATATION AND CURETTAGE /HYSTEROSCOPY ;  Surgeon: Marylynn Pearson, MD;  Location: [facility];  Service: Gynecology;  Laterality: N/A;  . I&D EXTREMITY Right 03/08/2014   Procedure: MINOR IRRIGATION AND DEBRIDEMENT EXTREMITY;  Surgeon: Mcarthur Rossetti, MD;  Location: [facility];  Service: Orthopedics;  Laterality: Right;  . INCISION AND DRAINAGE Right    right arm cat bite.  Marland Kitchen ROUX-EN-Y GASTRIC BYPASS  2004  . SHOULDER ARTHROSCOPY Right 2001     OB History   None      Home Medications    Prior to Admission medications   Medication Sig Start Date End Date Taking? Authorizing Provider  acetaminophen (TYLENOL 8 HOUR) 650 MG CR tablet Take 1 tablet (650 mg total) by mouth every 8 (eight) hours as needed. 12/13/17   Varney Biles, MD  cyanocobalamin (,VITAMIN B-12,) 1000 MCG/ML injection Inject 1,000 mcg into the muscle every 30 (thirty) days.     [provider]  estrogens, conjugated, (PREMARIN) 1.25 MG tablet Take 1.25 mg by mouth every evening.     [provider]  HYDROcodone-acetaminophen (NORCO/VICODIN) 5-325 MG tablet Take 1 tablet by mouth every 6 (six) hours as needed. 12/13/17   Varney Biles, MD  ibuprofen (ADVIL,MOTRIN) 800 MG tablet Take 1 tablet (800 mg total) by mouth 3 (three) times daily. 08/28/16   Molpus, Jenny Reichmann, MD  levothyroxine (SYNTHROID, LEVOTHROID)  150 MCG tablet Take 150 mcg by mouth every evening.     [provider]  Multiple Vitamin (MULTIVITAMIN) tablet Take 1 tablet by mouth daily.    [provider]  progesterone (PROMETRIUM) 100 MG capsule Take 100 mg by mouth every evening.    [provider]    Family History Family History  Problem Relation Age of Onset  . Hypertension Brother   . Depression Mother   . Mental illness Mother   . Cancer Mother   . Colon cancer Neg Hx     Social History Social History   Tobacco Use  . Smoking status: Former Smoker    Years:  14.00    Types: Cigarettes    Last attempt to quit: 11/17/1998    Years since quitting: 19.0  . Smokeless tobacco: Never Used  Substance Use Topics  . Alcohol use: Yes    Comment: occasionally 2-4 times per month  . Drug use: No     Allergies   Codeine and Penicillins   Review of Systems Review of Systems  Constitutional: Positive for activity change.  Gastrointestinal: Negative for nausea.  Musculoskeletal: Positive for arthralgias and joint swelling.  Neurological: Negative for numbness and headaches.  Hematological: Does not bruise/bleed easily.     Physical Exam Updated Vital Signs BP 130/85   Pulse 90   Resp 18   Wt 70.8 kg (156 lb)   LMP 03/05/2014   SpO2 100%   BMI 25.96 kg/m   Physical Exam  Constitutional: She is oriented to person, place, and time. She appears well-developed.  HENT:  Head: Atraumatic.  Eyes: EOM are normal.  Neck: Neck supple.  Cardiovascular: Normal rate.  Pulmonary/Chest: Effort normal.  Musculoskeletal: She exhibits edema, tenderness and deformity.  Right ankle is laterally subluxed.  Patient has 2+ dorsalis pedis.  She had 2+ posterior tibialis pulses well which was examined after patient was given ketamine.  Gross swelling with ecchymosis appreciated.  Neurological: She is alert and oriented to person, place, and time.  Skin: Skin is warm and dry.  Nursing note and vitals reviewed.    ED Treatments / Results  Labs (all labs ordered are listed, but only abnormal results are displayed) Labs Reviewed - No data to display  EKG None  Radiology Dg Ankle Complete Right  Result Date: 12/13/2017 CLINICAL DATA:  57 y/o  F; post reduction. EXAM: RIGHT ANKLE - COMPLETE 3+ VIEW COMPARISON:  12/13/2017 right ankle radiographs. FINDINGS: Trimalleolar fracture of the ankle joint with improved displaced post reduction. Mild persistent displacement of the fractures as well as lateral subluxation of the ankle mortise. Fine bony details are  obscured by the overlying cast. IMPRESSION: Acute trimalleolar fracture of the ankle joint with improved displaced post reduction. Mild persistent displacement of the fractures as well as lateral subluxation of the ankle mortise. Electronically Signed   By: Kristine Garbe M.D.   On: 12/13/2017 02:38   Dg Ankle Complete Right  Result Date: 12/13/2017 CLINICAL DATA:  57 y/o  F; fall with right ankle pain. EXAM: RIGHT ANKLE - COMPLETE 3+ VIEW COMPARISON:  08/28/2016 right ankle radiographs. FINDINGS: Acute 1 corner shaft's with displaced fracture of lateral malleolus extending to level of tibial plafond. Acute fracture of medial malleolus with mild lateral displacement. Acute minimally displaced fracture of posterior malleolus. Lateral subluxation of the ankle mortise. Talar dome is intact. No other fracture identified. Small dorsal and plantar calcaneal enthesophytes. IMPRESSION: Acute trimalleolar right ankle fracture. Lateral displacement of the  medial and lateral malleolus fractures as well as lateral ankle mortise subluxation. Electronically Signed   By: Kristine Garbe M.D.   On: 12/13/2017 00:43    Procedures  1. Fracture reduction 2. Procedural sedation 3. Splint application   Reduction of fracture Date/Time: 12/13/2017 2:55 AM Performed by: Varney Biles, MD Authorized by: Varney Biles, MD  Consent: Verbal consent obtained. Risks and benefits: risks, benefits and alternatives were discussed Consent given by: patient Patient understanding: patient states understanding of the procedure being performed Test results: test results available and properly labeled Imaging studies: imaging studies available Patient identity confirmed: arm band Time out: Immediately prior to procedure a "time out" was called to verify the correct patient, procedure, equipment, support staff and site/side marked as required. Local anesthesia used: no  Anesthesia: Local anesthesia used:  no Patient tolerance: Patient tolerated the procedure well with no immediate complications  .Sedation Date/Time: 12/13/2017 2:50 AM Performed by: Varney Biles, MD Authorized by: Varney Biles, MD   Consent:    Consent obtained:  Verbal   Consent given by:  Patient   Risks discussed:  Allergic reaction, dysrhythmia, nausea, vomiting and respiratory compromise necessitating ventilatory assistance and intubation Universal protocol:    Procedure explained and questions answered to patient or proxy's satisfaction: yes     Test results available and properly labeled: yes     Imaging studies available: yes     Site/side marked: yes     Immediately prior to procedure a time out was called: yes     Patient identity confirmation method:  Arm band Indications:    Procedure performed:  Fracture reduction   Procedure necessitating sedation performed by:  Physician performing sedation   Intended level of sedation:  Deep Pre-sedation assessment:    Time since last food or drink:  5 hours   ASA classification: class 2 - patient with mild systemic disease     Neck mobility: normal     Mouth opening:  3 or more finger widths   Mallampati score:  I - soft palate, uvula, fauces, pillars visible   Pre-sedation assessments completed and reviewed: airway patency, cardiovascular function, mental status, nausea/vomiting, pain level and respiratory function     Pre-sedation assessment completed:  12/14/2017 2:55 AM Immediate pre-procedure details:    Reassessment: Patient reassessed immediately prior to procedure     Reviewed: vital signs and NPO status     Verified: bag valve mask available, emergency equipment available, intubation equipment available, IV patency confirmed and oxygen available   Procedure details (see MAR for exact dosages):    Preoxygenation:  Nasal cannula   Sedation:  Ketamine   Analgesia:  None   Intra-procedure monitoring:  Continuous capnometry, continuous pulse oximetry,  frequent LOC assessments, frequent vital sign checks, cardiac monitor and blood pressure monitoring   Intra-procedure events: none     Total Provider sedation time (minutes):  25 Post-procedure details:    Post-sedation assessment completed:  12/14/2017 3:30 AM   Attendance: Constant attendance by certified staff until patient recovered     Recovery: Patient returned to pre-procedure baseline     Post-sedation assessments completed and reviewed: airway patency, cardiovascular function, mental status, nausea/vomiting, pain level and respiratory function     Patient is stable for discharge or admission: yes     Patient tolerance:  Tolerated well, no immediate complications .Splint Application Date/Time: 9/37/1696 3:00 AM Performed by: Varney Biles, MD Authorized by: Varney Biles, MD   Consent:    Consent obtained:  Verbal  Consent given by:  Patient   Risks discussed:  Discoloration, pain and numbness Pre-procedure details:    Sensation:  Normal Procedure details:    Laterality:  Right   Location:  Leg   Leg:  R lower leg   Strapping: no     Splint type:  Ankle stirrup and short leg   Supplies:  Plaster, elastic bandage and cotton padding Post-procedure details:    Pain:  Improved   Sensation:  Normal   Patient tolerance of procedure:  Tolerated well, no immediate complications Comments:     Performed by Orthopedic tech, I was present for the splinting in it's entirety.   (including critical care time)  Medications Ordered in ED Medications  ketamine 50 mg in normal saline 5 mL (10 mg/mL) syringe (71 mg Intravenous Given 12/13/17 0159)     Initial Impression / Assessment and Plan / ED Course  I have reviewed the triage vital signs and the nursing notes.  Pertinent labs & imaging results that were available during my care of the patient were reviewed by me and considered in my medical decision making (see chart for details).     57 year old relatively healthy woman  comes in after a fall.  Her mechanical fall has led to trimalleolar fracture. There is obvious right lateral displacement.  Patient is neurovascularly intact. We reduce the fracture in the ER, and splinted the patient, and she tolerated the procedure and sedation and the reduction well. She will follow-up with Dr. Ninfa Linden, orthopedic surgery for the operative care she will need.   Final Clinical Impressions(s) / ED Diagnoses   Final diagnoses:  Trimalleolar fracture of ankle, closed, right, initial encounter    ED Discharge Orders        Ordered    HYDROcodone-acetaminophen (NORCO/VICODIN) 5-325 MG tablet  Every 6 hours PRN     12/13/17 0310    acetaminophen (TYLENOL 8 HOUR) 650 MG CR tablet  Every 8 hours PRN     12/13/17 0310       Varney Biles, MD 12/14/17 0404

## 2017-12-16 ENCOUNTER — Encounter (HOSPITAL_COMMUNITY): Payer: Self-pay | Admitting: *Deleted

## 2017-12-16 ENCOUNTER — Other Ambulatory Visit: Payer: Self-pay

## 2017-12-20 ENCOUNTER — Ambulatory Visit (HOSPITAL_COMMUNITY)

## 2017-12-20 ENCOUNTER — Encounter (HOSPITAL_COMMUNITY)
Admission: RE | Disposition: A | Discharge status: Home / Self Care | Payer: Self-pay | Source: Ambulatory Visit | Attending: Orthopaedic Surgery

## 2017-12-20 ENCOUNTER — Ambulatory Visit (HOSPITAL_COMMUNITY): Admitting: Anesthesiology

## 2017-12-20 ENCOUNTER — Other Ambulatory Visit: Payer: Self-pay

## 2017-12-20 ENCOUNTER — Observation Stay (HOSPITAL_COMMUNITY)
Admission: RE | Admit: 2017-12-20 | Discharge: 2017-12-21 | Disposition: A | Discharge status: Home / Self Care | Source: Ambulatory Visit | Attending: Orthopaedic Surgery | Admitting: Orthopaedic Surgery

## 2017-12-20 ENCOUNTER — Encounter (HOSPITAL_COMMUNITY): Payer: Self-pay | Admitting: Orthopedic Surgery

## 2017-12-20 DIAGNOSIS — W010XXA Fall on same level from slipping, tripping and stumbling without subsequent striking against object, initial encounter: Secondary | ICD-10-CM | POA: Insufficient documentation

## 2017-12-20 DIAGNOSIS — Z87891 Personal history of nicotine dependence: Secondary | ICD-10-CM | POA: Diagnosis not present

## 2017-12-20 DIAGNOSIS — Z419 Encounter for procedure for purposes other than remedying health state, unspecified: Secondary | ICD-10-CM

## 2017-12-20 DIAGNOSIS — Z86718 Personal history of other venous thrombosis and embolism: Secondary | ICD-10-CM | POA: Diagnosis not present

## 2017-12-20 DIAGNOSIS — S82851A Displaced trimalleolar fracture of right lower leg, initial encounter for closed fracture: Principal | ICD-10-CM | POA: Diagnosis present

## 2017-12-20 DIAGNOSIS — M25571 Pain in right ankle and joints of right foot: Secondary | ICD-10-CM | POA: Diagnosis present

## 2017-12-20 HISTORY — DX: Other specified postprocedural states: Z98.890

## 2017-12-20 HISTORY — DX: Personal history of other medical treatment: Z92.89

## 2017-12-20 HISTORY — DX: Adverse effect of unspecified anesthetic, initial encounter: T41.45XA

## 2017-12-20 HISTORY — PX: ORIF ANKLE FRACTURE: SHX5408

## 2017-12-20 HISTORY — DX: Other specified postprocedural states: R11.2

## 2017-12-20 LAB — CBC
HCT: 32.8 % — ABNORMAL LOW (ref 36.0–46.0)
HCT: 29.5 % — ABNORMAL LOW (ref 36.0–46.0)
HEMOGLOBIN: 8.9 g/dL — AB (ref 12.0–15.0)
Hemoglobin: 10 g/dL — ABNORMAL LOW (ref 12.0–15.0)
MCH: 26.2 pg (ref 26.0–34.0)
MCH: 26.3 pg (ref 26.0–34.0)
MCHC: 30.5 g/dL (ref 30.0–36.0)
MCHC: 30.2 g/dL (ref 30.0–36.0)
MCV: 85.9 fL (ref 78.0–100.0)
MCV: 87 fL (ref 78.0–100.0)
PLATELETS: 461 10*3/uL — AB (ref 150–400)
PLATELETS: 407 10*3/uL — AB (ref 150–400)
RBC: 3.82 MIL/uL — ABNORMAL LOW (ref 3.87–5.11)
RBC: 3.39 MIL/uL — AB (ref 3.87–5.11)
RDW: 15.1 % (ref 11.5–15.5)
RDW: 15.1 % (ref 11.5–15.5)
WBC: 8.3 10*3/uL (ref 4.0–10.5)
WBC: 7 10*3/uL (ref 4.0–10.5)

## 2017-12-20 LAB — CREATININE, SERUM: CREATININE: 0.86 mg/dL (ref 0.44–1.00)

## 2017-12-20 SURGERY — OPEN REDUCTION INTERNAL FIXATION (ORIF) ANKLE FRACTURE
Anesthesia: General | Site: Ankle | Laterality: Right

## 2017-12-20 MED ORDER — MORPHINE SULFATE (PF) 2 MG/ML IV SOLN
0.5000 mg | INTRAVENOUS | Status: DC | PRN
  Administered 2017-12-21: 1 mg via INTRAVENOUS
  Filled 2017-12-20: qty 1

## 2017-12-20 MED ORDER — HYDROCODONE-ACETAMINOPHEN 5-325 MG PO TABS
1.0000 | ORAL_TABLET | ORAL | Status: DC | PRN
  Administered 2017-12-20 – 2017-12-21 (×3): 2 via ORAL
  Filled 2017-12-20 (×3): qty 2

## 2017-12-20 MED ORDER — DOCUSATE SODIUM 100 MG PO CAPS
100.0000 mg | ORAL_CAPSULE | Freq: Two times a day (BID) | ORAL | Status: DC
  Administered 2017-12-20 – 2017-12-21 (×2): 100 mg via ORAL
  Filled 2017-12-20 (×2): qty 1

## 2017-12-20 MED ORDER — METOCLOPRAMIDE HCL 5 MG PO TABS: 5.0000 mg | ORAL_TABLET | Freq: Three times a day (TID) | ORAL | Status: DC | PRN

## 2017-12-20 MED ORDER — MIDAZOLAM HCL 2 MG/2ML IJ SOLN
INTRAMUSCULAR | Status: AC
  Filled 2017-12-20: qty 2

## 2017-12-20 MED ORDER — PHENYLEPHRINE HCL 10 MG/ML IJ SOLN
INTRAMUSCULAR | Status: DC | PRN
  Administered 2017-12-20 (×2): 40 ug via INTRAVENOUS

## 2017-12-20 MED ORDER — ROCURONIUM BROMIDE 10 MG/ML (PF) SYRINGE
PREFILLED_SYRINGE | INTRAVENOUS | Status: AC
  Filled 2017-12-20: qty 5

## 2017-12-20 MED ORDER — FENTANYL CITRATE (PF) 100 MCG/2ML IJ SOLN: 25.0000 ug | INTRAMUSCULAR | Status: DC | PRN

## 2017-12-20 MED ORDER — POLYETHYLENE GLYCOL 3350 17 G PO PACK: 17.0000 g | PACK | Freq: Every day | ORAL | Status: DC | PRN

## 2017-12-20 MED ORDER — PANTOPRAZOLE SODIUM 40 MG PO TBEC
40.0000 mg | DELAYED_RELEASE_TABLET | Freq: Every day | ORAL | Status: DC
  Administered 2017-12-20 – 2017-12-21 (×2): 40 mg via ORAL
  Filled 2017-12-20 (×2): qty 1

## 2017-12-20 MED ORDER — LIDOCAINE 2% (20 MG/ML) 5 ML SYRINGE
INTRAMUSCULAR | Status: AC
  Filled 2017-12-20: qty 5

## 2017-12-20 MED ORDER — FENTANYL CITRATE (PF) 250 MCG/5ML IJ SOLN
INTRAMUSCULAR | Status: AC
  Filled 2017-12-20: qty 5

## 2017-12-20 MED ORDER — BUPIVACAINE HCL (PF) 0.25 % IJ SOLN
INTRAMUSCULAR | Status: AC
  Filled 2017-12-20: qty 30

## 2017-12-20 MED ORDER — OXYCODONE HCL 5 MG/5ML PO SOLN: 5.0000 mg | Freq: Once | ORAL | Status: DC | PRN

## 2017-12-20 MED ORDER — OXYCODONE HCL 5 MG PO TABS: 5.0000 mg | ORAL_TABLET | Freq: Once | ORAL | Status: DC | PRN

## 2017-12-20 MED ORDER — LIDOCAINE 2% (20 MG/ML) 5 ML SYRINGE
INTRAMUSCULAR | Status: DC | PRN
  Administered 2017-12-20: 60 mg via INTRAVENOUS

## 2017-12-20 MED ORDER — METOCLOPRAMIDE HCL 5 MG/ML IJ SOLN: 5.0000 mg | Freq: Three times a day (TID) | INTRAMUSCULAR | Status: DC | PRN

## 2017-12-20 MED ORDER — METHOCARBAMOL 1000 MG/10ML IJ SOLN
500.0000 mg | Freq: Four times a day (QID) | INTRAVENOUS | Status: DC | PRN
  Filled 2017-12-20: qty 5

## 2017-12-20 MED ORDER — FENTANYL CITRATE (PF) 100 MCG/2ML IJ SOLN
INTRAMUSCULAR | Status: AC
Start: 2017-12-20 — End: 2017-12-20
  Administered 2017-12-20: 100 ug via INTRAVENOUS
  Filled 2017-12-20: qty 2

## 2017-12-20 MED ORDER — ESTROGENS CONJUGATED 1.25 MG PO TABS
1.2500 mg | ORAL_TABLET | Freq: Every day | ORAL | Status: DC
  Administered 2017-12-20: 1.25 mg via ORAL
  Filled 2017-12-20: qty 1

## 2017-12-20 MED ORDER — CLINDAMYCIN PHOSPHATE 600 MG/50ML IV SOLN
600.0000 mg | Freq: Four times a day (QID) | INTRAVENOUS | Status: AC
  Administered 2017-12-20 – 2017-12-21 (×3): 600 mg via INTRAVENOUS
  Filled 2017-12-20 (×3): qty 50

## 2017-12-20 MED ORDER — ONDANSETRON HCL 4 MG/2ML IJ SOLN
INTRAMUSCULAR | Status: AC
  Filled 2017-12-20: qty 2

## 2017-12-20 MED ORDER — CHLORHEXIDINE GLUCONATE 4 % EX LIQD: 60.0000 mL | Freq: Once | CUTANEOUS | Status: DC

## 2017-12-20 MED ORDER — 0.9 % SODIUM CHLORIDE (POUR BTL) OPTIME
TOPICAL | Status: DC | PRN
Start: 2017-12-20 — End: 2017-12-20
  Administered 2017-12-20: 1000 mL

## 2017-12-20 MED ORDER — PROPOFOL 10 MG/ML IV BOLUS
INTRAVENOUS | Status: DC | PRN
  Administered 2017-12-20: 200 mg via INTRAVENOUS

## 2017-12-20 MED ORDER — METHOCARBAMOL 500 MG PO TABS
500.0000 mg | ORAL_TABLET | Freq: Four times a day (QID) | ORAL | Status: DC | PRN
  Administered 2017-12-21 (×2): 500 mg via ORAL
  Filled 2017-12-20 (×2): qty 1

## 2017-12-20 MED ORDER — FENTANYL CITRATE (PF) 100 MCG/2ML IJ SOLN
100.0000 ug | Freq: Once | INTRAMUSCULAR | Status: AC
  Administered 2017-12-20: 100 ug via INTRAVENOUS

## 2017-12-20 MED ORDER — CLINDAMYCIN PHOSPHATE 900 MG/50ML IV SOLN
900.0000 mg | INTRAVENOUS | Status: DC
  Filled 2017-12-20: qty 50

## 2017-12-20 MED ORDER — CEFAZOLIN SODIUM-DEXTROSE 1-4 GM/50ML-% IV SOLN
INTRAVENOUS | Status: DC | PRN
  Administered 2017-12-20: 2 g via INTRAVENOUS

## 2017-12-20 MED ORDER — ENOXAPARIN SODIUM 40 MG/0.4ML ~~LOC~~ SOLN
40.0000 mg | SUBCUTANEOUS | Status: DC
  Administered 2017-12-21: 40 mg via SUBCUTANEOUS
  Filled 2017-12-20: qty 0.4

## 2017-12-20 MED ORDER — PHENYLEPHRINE 40 MCG/ML (10ML) SYRINGE FOR IV PUSH (FOR BLOOD PRESSURE SUPPORT)
PREFILLED_SYRINGE | INTRAVENOUS | Status: AC
  Filled 2017-12-20: qty 20

## 2017-12-20 MED ORDER — ONDANSETRON HCL 4 MG/2ML IJ SOLN: 4.0000 mg | Freq: Four times a day (QID) | INTRAMUSCULAR | Status: DC | PRN

## 2017-12-20 MED ORDER — MIDAZOLAM HCL 2 MG/2ML IJ SOLN
2.0000 mg | Freq: Once | INTRAMUSCULAR | Status: AC
  Administered 2017-12-20: 2 mg via INTRAVENOUS

## 2017-12-20 MED ORDER — MIDAZOLAM HCL 2 MG/2ML IJ SOLN
INTRAMUSCULAR | Status: AC
  Administered 2017-12-20: 2 mg via INTRAVENOUS
  Filled 2017-12-20: qty 2

## 2017-12-20 MED ORDER — HYDROCODONE-ACETAMINOPHEN 7.5-325 MG PO TABS: 1.0000 | ORAL_TABLET | ORAL | Status: DC | PRN

## 2017-12-20 MED ORDER — EPHEDRINE SULFATE 50 MG/ML IJ SOLN
INTRAMUSCULAR | Status: AC
  Filled 2017-12-20: qty 1

## 2017-12-20 MED ORDER — SODIUM CHLORIDE 0.9 % IV SOLN
INTRAVENOUS | Status: DC
  Administered 2017-12-20: via INTRAVENOUS

## 2017-12-20 MED ORDER — PROPOFOL 10 MG/ML IV BOLUS
INTRAVENOUS | Status: AC
  Filled 2017-12-20: qty 40

## 2017-12-20 MED ORDER — PROGESTERONE MICRONIZED 100 MG PO CAPS
100.0000 mg | ORAL_CAPSULE | Freq: Every day | ORAL | Status: DC
  Administered 2017-12-20: 100 mg via ORAL
  Filled 2017-12-20: qty 1

## 2017-12-20 MED ORDER — LACTATED RINGERS IV SOLN
INTRAVENOUS | Status: DC
  Administered 2017-12-20 (×2): via INTRAVENOUS

## 2017-12-20 MED ORDER — CEFAZOLIN SODIUM 1 G IJ SOLR
INTRAMUSCULAR | Status: AC
  Filled 2017-12-20: qty 20

## 2017-12-20 MED ORDER — ONDANSETRON HCL 4 MG PO TABS
4.0000 mg | ORAL_TABLET | Freq: Four times a day (QID) | ORAL | Status: DC | PRN
Start: 2017-12-20 — End: 2017-12-21

## 2017-12-20 MED ORDER — BUPIVACAINE-EPINEPHRINE (PF) 0.5% -1:200000 IJ SOLN
INTRAMUSCULAR | Status: DC | PRN
  Administered 2017-12-20 (×2): 20 mL via PERINEURAL

## 2017-12-20 MED ORDER — DIPHENHYDRAMINE HCL 12.5 MG/5ML PO ELIX: 12.5000 mg | ORAL_SOLUTION | ORAL | Status: DC | PRN

## 2017-12-20 MED ORDER — FENTANYL CITRATE (PF) 100 MCG/2ML IJ SOLN
INTRAMUSCULAR | Status: DC | PRN
  Administered 2017-12-20 (×2): 25 ug via INTRAVENOUS
  Administered 2017-12-20: 50 ug via INTRAVENOUS
  Administered 2017-12-20 (×2): 25 ug via INTRAVENOUS

## 2017-12-20 MED ORDER — ACETAMINOPHEN 325 MG PO TABS: 325.0000 mg | ORAL_TABLET | Freq: Four times a day (QID) | ORAL | Status: DC | PRN

## 2017-12-20 MED ORDER — LEVOTHYROXINE SODIUM 100 MCG PO TABS
200.0000 ug | ORAL_TABLET | Freq: Every day | ORAL | Status: DC
  Administered 2017-12-20: 200 ug via ORAL
  Filled 2017-12-20: qty 2

## 2017-12-20 MED ORDER — DEXAMETHASONE SODIUM PHOSPHATE 10 MG/ML IJ SOLN
INTRAMUSCULAR | Status: AC
  Filled 2017-12-20: qty 1

## 2017-12-20 MED ORDER — SODIUM CHLORIDE 0.9 % IJ SOLN
INTRAMUSCULAR | Status: AC
  Filled 2017-12-20: qty 10

## 2017-12-20 MED ORDER — MIDAZOLAM HCL 5 MG/5ML IJ SOLN
INTRAMUSCULAR | Status: DC | PRN
  Administered 2017-12-20: 1 mg via INTRAVENOUS

## 2017-12-20 MED ORDER — DEXAMETHASONE SODIUM PHOSPHATE 4 MG/ML IJ SOLN
INTRAMUSCULAR | Status: DC | PRN
  Administered 2017-12-20: 5 mg via INTRAVENOUS

## 2017-12-20 MED ORDER — ADULT MULTIVITAMIN W/MINERALS CH
1.0000 | ORAL_TABLET | Freq: Every day | ORAL | Status: DC
  Administered 2017-12-20: 1 via ORAL
  Filled 2017-12-20: qty 1

## 2017-12-20 SURGICAL SUPPLY — 75 items
BANDAGE ACE 4X5 VEL STRL LF (GAUZE/BANDAGES/DRESSINGS) IMPLANT
BANDAGE ACE 6X5 VEL STRL LF (GAUZE/BANDAGES/DRESSINGS) IMPLANT
BANDAGE ELASTIC 4 VELCRO ST LF (GAUZE/BANDAGES/DRESSINGS) ×3 IMPLANT
BANDAGE ELASTIC 6 VELCRO ST LF (GAUZE/BANDAGES/DRESSINGS) ×3 IMPLANT
BANDAGE ESMARK 6X9 LF (GAUZE/BANDAGES/DRESSINGS) IMPLANT
BIT DRILL 2.0 (BIT) ×3 IMPLANT
BIT DRILL CANN 2.7 (BIT) ×1
BIT DRILL CANN 2.7MM (BIT) ×1
BIT DRILL SRG 2.7XCANN AO CPLG (BIT) ×1 IMPLANT
BIT DRL SRG 2.7XCANN AO CPLNG (BIT) ×1
BNDG ESMARK 6X9 LF (GAUZE/BANDAGES/DRESSINGS)
COVER SURGICAL LIGHT HANDLE (MISCELLANEOUS) ×3 IMPLANT
CUFF TOURNIQUET SINGLE 34IN LL (TOURNIQUET CUFF) IMPLANT
CUFF TOURNIQUET SINGLE 44IN (TOURNIQUET CUFF) IMPLANT
DRAPE C-ARM 42X72 X-RAY (DRAPES) ×6 IMPLANT
DRAPE C-ARMOR (DRAPES) ×3 IMPLANT
DRAPE U-SHAPE 47X51 STRL (DRAPES) ×3 IMPLANT
DRILL 2.6X122MM WL AO SHAFT (BIT) ×3 IMPLANT
DURAPREP 26ML APPLICATOR (WOUND CARE) ×3 IMPLANT
ELECT REM PT RETURN 9FT ADLT (ELECTROSURGICAL) ×3
ELECTRODE REM PT RTRN 9FT ADLT (ELECTROSURGICAL) ×1 IMPLANT
GAUZE SPONGE 4X4 12PLY STRL (GAUZE/BANDAGES/DRESSINGS) ×3 IMPLANT
GAUZE XEROFORM 5X9 LF (GAUZE/BANDAGES/DRESSINGS) ×3 IMPLANT
GLOVE BIO SURGEON STRL SZ8 (GLOVE) ×3 IMPLANT
GLOVE BIOGEL PI IND STRL 8 (GLOVE) ×2 IMPLANT
GLOVE BIOGEL PI INDICATOR 8 (GLOVE) ×4
GLOVE ORTHO TXT STRL SZ7.5 (GLOVE) ×3 IMPLANT
GOWN STRL REUS W/ TWL LRG LVL3 (GOWN DISPOSABLE) ×2 IMPLANT
GOWN STRL REUS W/ TWL XL LVL3 (GOWN DISPOSABLE) ×4 IMPLANT
GOWN STRL REUS W/TWL LRG LVL3 (GOWN DISPOSABLE) ×4
GOWN STRL REUS W/TWL XL LVL3 (GOWN DISPOSABLE) ×8
K-WIRE ORTHOPEDIC 1.4X150L (WIRE) ×9
KIT BASIN OR (CUSTOM PROCEDURE TRAY) ×3 IMPLANT
KIT TURNOVER KIT B (KITS) ×3 IMPLANT
KWIRE ORTHOPEDIC 1.4X150L (WIRE) ×3 IMPLANT
MANIFOLD NEPTUNE II (INSTRUMENTS) ×3 IMPLANT
NEEDLE HYPO 25GX1X1/2 BEV (NEEDLE) IMPLANT
NS IRRIG 1000ML POUR BTL (IV SOLUTION) ×3 IMPLANT
PACK ORTHO EXTREMITY (CUSTOM PROCEDURE TRAY) ×3 IMPLANT
PAD ABD 8X10 STRL (GAUZE/BANDAGES/DRESSINGS) ×3 IMPLANT
PAD ARMBOARD 7.5X6 YLW CONV (MISCELLANEOUS) ×6 IMPLANT
PAD CAST 4YDX4 CTTN HI CHSV (CAST SUPPLIES) ×1 IMPLANT
PADDING CAST COTTON 4X4 STRL (CAST SUPPLIES) ×2
PADDING CAST COTTON 6X4 STRL (CAST SUPPLIES) ×3 IMPLANT
PLATE DISTAL FIBULA 3HOLE (Plate) ×3 IMPLANT
PLATE TABULAR 3H 1/3 (Plate) ×3 IMPLANT
PREFILTER NEPTUNE (MISCELLANEOUS) ×3 IMPLANT
SCREW 6.5 CANNULATED (Screw) ×3 IMPLANT
SCREW BONE 2.7X20MM (Screw) ×3 IMPLANT
SCREW BONE 3.5X16MM (Screw) ×6 IMPLANT
SCREW BONE ANKLE 3.5X14MM (Screw) ×9 IMPLANT
SCREW BONE NON-LCKING 3.5X12MM (Screw) ×3 IMPLANT
SCREW CANCELLOUS 4.0X14 (Screw) ×3 IMPLANT
SCREW CANCELLOUS 4.0X30MM (Screw) ×3 IMPLANT
SCREW CANN ANIS III SS 4.0X60 (Screw) ×6 IMPLANT
SCREW CANNULATED 28MMX4.0MM (Screw) ×3 IMPLANT
SCREW CANNULATED 4.1X40 (Screw) ×3 IMPLANT
SCREW LOCK 4.0X10 CANCELLOUS (Screw) ×3 IMPLANT
SCREW LOCK 4.0X28 CANCELLOUS (Screw) ×3 IMPLANT
SCREW LOCKING 3.5X16MM (Screw) ×3 IMPLANT
SCREW VA CANNUL LOCK 5.0X60MM (Screw) ×6 IMPLANT
SPONGE LAP 4X18 X RAY DECT (DISPOSABLE) ×6 IMPLANT
SUCTION FRAZIER HANDLE 10FR (MISCELLANEOUS) ×2
SUCTION TUBE FRAZIER 10FR DISP (MISCELLANEOUS) ×1 IMPLANT
SUT ETHILON 2 0 FS 18 (SUTURE) ×9 IMPLANT
SUT VIC AB 0 CT1 27 (SUTURE) ×4
SUT VIC AB 0 CT1 27XBRD ANBCTR (SUTURE) ×2 IMPLANT
SUT VIC AB 2-0 CT1 27 (SUTURE) ×2
SUT VIC AB 2-0 CT1 TAPERPNT 27 (SUTURE) ×1 IMPLANT
SYR CONTROL 10ML LL (SYRINGE) IMPLANT
TOWEL OR 17X24 6PK STRL BLUE (TOWEL DISPOSABLE) ×3 IMPLANT
TOWEL OR 17X26 10 PK STRL BLUE (TOWEL DISPOSABLE) ×3 IMPLANT
TUBE CONNECTING 12'X1/4 (SUCTIONS) ×1
TUBE CONNECTING 12X1/4 (SUCTIONS) ×2 IMPLANT
WATER STERILE IRR 1000ML POUR (IV SOLUTION) ×3 IMPLANT

## 2017-12-20 NOTE — Anesthesia Procedure Notes (Signed)
Anesthesia Regional Block: Popliteal block   Pre-Anesthetic Checklist: ,, timeout performed, Correct Patient, Correct Site, Correct Laterality, Correct Procedure, Correct Position, site marked, Risks and benefits discussed,  Surgical consent,  Pre-op evaluation,  At surgeon's request and post-op pain management  Laterality: Right  Prep: chloraprep       Needles:  Injection technique: Single-shot  Needle Type: Echogenic Stimulator Needle          Additional Needles:   Procedures:, nerve stimulator,,,,,,,   Nerve Stimulator or Paresthesia:  Response: plantar flexion of foot, 0.45 mA,   Additional Responses:   Narrative:  Start time: 12/20/2017 4:27 PM End time: 12/20/2017 4:31 PM Injection made incrementally with aspirations every 5 mL.  Performed by: Personally  Anesthesiologist: Albertha Ghee, MD  Additional Notes: Functioning IV was confirmed and monitors were applied.  A 74mm 21ga Arrow echogenic stimulator needle was used. Sterile prep and drape,hand hygiene and sterile gloves were used.  Negative aspiration and negative test dose prior to incremental administration of local anesthetic. The patient tolerated the procedure well.  Ultrasound guidance: relevent anatomy identified, needle position confirmed, local anesthetic spread visualized around nerve(s), vascular puncture avoided.  Image printed for medical record.

## 2017-12-20 NOTE — Anesthesia Procedure Notes (Signed)
Anesthesia Regional Block: Adductor canal block   Pre-Anesthetic Checklist: ,, timeout performed, Correct Patient, Correct Site, Correct Laterality, Correct Procedure, Correct Position, site marked, Risks and benefits discussed,  Surgical consent,  Pre-op evaluation,  At surgeon's request and post-op pain management  Laterality: Right  Prep: chloraprep       Needles:  Injection technique: Single-shot  Needle Type: Echogenic Needle     Needle Length: 9cm  Needle Gauge: 21     Additional Needles:   Narrative:  Start time: 12/20/2017 4:27 PM End time: 12/20/2017 4:32 PM Injection made incrementally with aspirations every 5 mL.  Performed by: Personally  Anesthesiologist: Albertha Ghee, MD  Additional Notes: Pt tolerated the procedure well.

## 2017-12-20 NOTE — Brief Op Note (Signed)
12/20/2017  7:11 PM  PATIENT:  Alexis Arnold  57 y.o. female  PRE-OPERATIVE DIAGNOSIS:  right trimalleolar ankle fracture  POST-OPERATIVE DIAGNOSIS:  right trimalleolar ankle fracture  PROCEDURE:  Procedure(s): OPEN REDUCTION INTERNAL FIXATION (ORIF) RIGHT ANKLE FRACTURE (Right)  SURGEON:  Surgeon(s) and Role:    Mcarthur Rossetti, MD - Primary  PHYSICIAN ASSISTANT: Benita Stabile, PA-C  ANESTHESIA:   regional and general  COUNTS:  YES  TOURNIQUET:   Total Tourniquet Time Documented: Thigh (Right) - 106 minutes Total: Thigh (Right) - 106 minutes   DICTATION: .Other Dictation: Dictation Number [id number]  PLAN OF CARE: Admit for overnight observation  PATIENT DISPOSITION:  PACU - hemodynamically stable.   Delay start of Pharmacological VTE agent (>24hrs) due to surgical blood loss or risk of bleeding: no

## 2017-12-20 NOTE — Op Note (Signed)
NAMEDARCUS, EDDS MEDICAL RECORD [id number] ACCOUNT [id number] DATE OF BIRTH:08-05-1960 FACILITY: [facility] LOCATION: [facility] PHYSICIAN:Taiana Temkin Kerry Fort, MD  OPERATIVE REPORT  DATE OF PROCEDURE:  12/20/2017  PREOPERATIVE DIAGNOSIS:  Right ankle trimalleolar fracture dislocation.  POSTOPERATIVE DIAGNOSIS:  Right ankle trimalleolar fracture dislocation.  PROCEDURE:  Open reduction internal fixation of right unstable trimalleolar ankle fracture with fixation of the lateral and medial sides only.  FINDINGS:  Trimalleolar ankle fracture with only a small posterior malleolus piece with a vertical shear fracture of the medial malleolus.  IMPLANTS:  Stryker VariAx distal fibular 3-hole plate laterally and a stainless steel 1/3 semitubular plate medially as well as cannulated screws medially.  SURGEON:  Jean Rosenthal, MD  ASSISTANT:  Benita Stabile, PA-C  ANESTHESIA:   1.  Right lower extremity regional block. 2.  General.  TOURNIQUET TIME:  One and a half hours.  ESTIMATED BLOOD LOSS:  Minimal.  COMPLICATIONS:  None.  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  This was at home.  She was seen at the emergency room and was found to have a trimalleolar  ankle fracture dislocation.  This was reduced.  This was a closed fracture and was reduced by the ER staff and she was placed appropriately in a splint.  She now presents for definitive fixation of this fracture, understanding this is an unstable  fracture that warrants fixation.  The risks and benefits of surgery were explained to her in detail.  She does wish to proceed.  DESCRIPTION OF PROCEDURE:  After informed consent was obtained.  Appropriate right ankle was marked.  Anesthesia was obtained, a regional block in the holding room.  She was brought to the operating room and placed supine on the operating  table.  General  anesthesia was then obtained.  A nonsterile tourniquet was placed around the upper right thigh and her right knee of leg and ankle and foot were prepped and draped with DuraPrep and sterile drapes.  A time-out was called and she identified correct  patient, correct right ankle.  We then elevated the leg and then had the tourniquet inflated to 300 mm of pressure.  We then made a direct incision laterally over the fibula and carried this proximally and distally.  We dissected down to the fracture.  I  have noted that she did have an old lateral malleolus fracture as well.  She was found to have significantly soft bone.  I was able to get the fracture out to length with temporary reduction forceps.  We then chose a Stryker 3-hole fibular plate and  secured this with bicortical screws proximally and four locking screws distally.  We also had to place a small screw in the area of the fibula where the anterior talofibular ligament attaches.  This secured this down.  We then went to the medial side and  made a separate medial incision.  We found a vertical shear fracture medially.  We held this reduced position.  I placed 2 parallel screws that were partially threaded titanium screws that I placed medially, but once I got those down, I felt that there  was still some instability to the vertical shear piece.  I then chose a stainless steel 3-hole 1/3 semitubular plate and I put this as a buttress antiglide plate along the medial border of the tibia and secured this with a stainless steel screw  proximally and  a stainless steel screw distally.  With that being said, I backed out the cancellous screws that were titanium and replaced this with stainless steel screws as well.  This was all done under direct fluoroscopy and we assessed the ankle  mortise and it was congruent and we were able to stress it was stable afterwards.  We then irrigated both wounds with normal saline solution and closed the deep  tissue over the hardware with interrupted 0 Vicryl suture followed by 2-0 Vicryl subcutaneous  tissue and interrupted 2-0 nylon in the skin.  A well-padded soft dressing was applied.  The tourniquet was let down.  The toes pinked up nicely.    She was awakened and extubated and taken to recovery in stable condition.  All final counts were correct.  There were no complications noted.    Of note, Benita Stabile, PA-C assisted the entire case.  His assistance was crucial in facilitating all aspects of this case.  AN/NUANCE  D:12/20/2017 T:12/20/2017 JOB:[id number]

## 2017-12-20 NOTE — Transfer of Care (Signed)
Immediate Anesthesia Transfer of Care Note  Patient: Alexis Arnold  Procedure(s) Performed: OPEN REDUCTION INTERNAL FIXATION (ORIF) RIGHT ANKLE FRACTURE (Right Ankle)  Patient Location: PACU  Anesthesia Type:GA combined with regional for post-op pain  Level of Consciousness: awake and alert   Airway & Oxygen Therapy: Patient Spontanous Breathing and Patient connected to nasal cannula oxygen  Post-op Assessment: Report given to RN and Post -op Vital signs reviewed and stable  Post vital signs: Reviewed and stable  Last Vitals:  Vitals Value Taken Time  BP 125/49 12/20/2017  7:24 PM  Temp 36.4 C 12/20/2017  7:22 PM  Pulse 93 12/20/2017  7:29 PM  Resp 18 12/20/2017  7:29 PM  SpO2 90 % 12/20/2017  7:29 PM  Vitals shown include unvalidated device data.  Last Pain:  Vitals:   12/20/17 1922  TempSrc:   PainSc: 0-No pain         Complications: No apparent anesthesia complications

## 2017-12-20 NOTE — H&P (Signed)
Alexis Arnold is an 57 y.o. female.   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  I seen her before for an ankle injury and she unfortunately reinjured her right ankle recently after mechanical fall tripping over her dog at home.  She is brought to the [place] on emergency room and found to have a displaced trimalleolar ankle fracture of the right ankle.  The ER staff reduce the fracture and placed appropriately in a splint.  Due to the unstable nature of this type of injury she understands that we are recommending open reduction-internal fixation for her right ankle.  She presents for surgery today.  Past Medical History:  Diagnosis Date  . Complication of anesthesia 2001  . Endometrial mass   . History of adenomatous polyp of colon    tubular adenoma 10-31-2013  . History of blood transfusion   . History of DVT of lower extremity    right lower extremity 07-30-2008  . Hypothyroidism   . 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 PMB (postmenopausal bleeding)   . PONV (postoperative nausea and vomiting)    post op nausea, treated succedfully with Zofran  . Tubular adenoma of colon     Past Surgical History:  Procedure Laterality Date  . CATARACT EXTRACTION W/ INTRAOCULAR LENS  IMPLANT, BILATERAL  2016  . CESAREAN SECTION  1990  . COLONOSCOPY W/ POLYPECTOMY    . COMBINED REDUCTION MAMMAPLASTY W/ ABDOMINOPLASTY  2014   with panniculectomy  . HYSTEROSCOPY W/D&C N/A 11/21/2015   Procedure: DILATATION AND CURETTAGE /HYSTEROSCOPY ;  Surgeon: Marylynn Pearson, MD;  Location: [facility];  Service: Gynecology;  Laterality: N/A;  . I&D EXTREMITY Right 03/08/2014   Procedure: MINOR IRRIGATION AND DEBRIDEMENT EXTREMITY;  Surgeon: Mcarthur Rossetti, MD;  Location: [facility];  Service: Orthopedics;  Laterality: Right;  . INCISION AND DRAINAGE  Right    right arm cat bite.  Marland Kitchen ROUX-EN-Y GASTRIC BYPASS  2004  . SHOULDER ARTHROSCOPY Right 2001    Family History  Problem Relation Age of Onset  . Hypertension Brother   . Depression Mother   . Mental illness Mother   . Cancer Mother   . Colon cancer Neg Hx    Social History:  reports that she quit smoking about 19 years ago. Her smoking use included cigarettes. She quit after 14.00 years of use. She has never used smokeless tobacco. She reports that she drinks alcohol. She reports that she does not use drugs.  Allergies:  Allergies  Allergen Reactions  . Codeine Hives  . Penicillins Other (See Comments)    Patient has anaphylactic reaction  to brethine(preservative) in injectables. Can take all others    No medications prior to admission.    No results found for this or any previous visit (from the past 48 hour(s)). No results found.  Review of Systems  Musculoskeletal: Positive for joint pain.  All other systems reviewed and are negative.   Last menstrual period 03/05/2014. Physical Exam  Constitutional: She is oriented to person, place, and time. She appears well-developed and well-nourished.  HENT:  Head: Normocephalic and atraumatic.  Eyes: Pupils are equal, round, and reactive to light. EOM are normal.  Neck: Normal range of motion. Neck supple.  Cardiovascular: Normal rate and regular rhythm.  Respiratory: Effort normal and breath sounds normal.  GI: Soft. Bowel sounds are normal.  Musculoskeletal:       Right ankle: She exhibits decreased range of motion, swelling, ecchymosis and deformity. Tenderness. Lateral malleolus, medial malleolus and posterior TFL tenderness found.  Neurological: She is alert and oriented to person, place, and time.  Skin: Skin is warm.  Psychiatric: She has a normal mood and affect.     Assessment/Plan I am very  The plan will be to proceed to surgery today for open reduction internal fixation of the lateral and medial  malleolus.  We will most likely not need to fix the posterior malleolus.  She will then be admitted overnight for observation which will include IV antibiotics and pain medication as well as having therapy work with her tomorrow for mobilization.  Risk benefits surgery explained in detail and informed sent was obtained.  Mcarthur Rossetti, MD 12/20/2017, 2:14 PM

## 2017-12-20 NOTE — Anesthesia Procedure Notes (Signed)
Procedure Name: LMA Insertion Date/Time: 12/20/2017 5:12 PM Performed by: Cleda Daub, CRNA Pre-anesthesia Checklist: Patient identified, Emergency Drugs available, Suction available and Patient being monitored Patient Re-evaluated:Patient Re-evaluated prior to induction Oxygen Delivery Method: Circle system utilized Preoxygenation: Pre-oxygenation with 100% oxygen Induction Type: IV induction Ventilation: Mask ventilation without difficulty and Mask ventilation throughout procedure LMA: LMA inserted LMA Size: 4.0 Number of attempts: 1 Placement Confirmation: positive ETCO2 Tube secured with: Tape Dental Injury: Teeth and Oropharynx as per pre-operative assessment

## 2017-12-20 NOTE — Anesthesia Preprocedure Evaluation (Signed)
Anesthesia Evaluation  Patient identified by MRN, date of birth, ID band Patient awake    Reviewed: Allergy & Precautions, H&P , NPO status , Patient's Chart, lab work & pertinent test results  History of Anesthesia Complications (+) PONV and history of anesthetic complications  Airway Mallampati: II   Neck ROM: full    Dental   Pulmonary former smoker,    breath sounds clear to auscultation       Cardiovascular negative cardio ROS   Rhythm:regular Rate:Normal     Neuro/Psych    GI/Hepatic   Endo/Other  Hypothyroidism   Renal/GU      Musculoskeletal   Abdominal   Peds  Hematology   Anesthesia Other Findings   Reproductive/Obstetrics                             Anesthesia Physical Anesthesia Plan  ASA: II  Anesthesia Plan: General   Post-op Pain Management:  Regional for Post-op pain   Induction: Intravenous  PONV Risk Score and Plan: 4 or greater and Ondansetron, Dexamethasone, Midazolam, Treatment may vary due to age or medical condition and Scopolamine patch - Pre-op  Airway Management Planned: LMA  Additional Equipment:   Intra-op Plan:   Post-operative Plan:   Informed Consent: I have reviewed the patients History and Physical, chart, labs and discussed the procedure including the risks, benefits and alternatives for the proposed anesthesia with the patient or authorized representative who has indicated his/her understanding and acceptance.     Plan Discussed with: CRNA, Anesthesiologist and Surgeon  Anesthesia Plan Comments:         Anesthesia Quick Evaluation

## 2017-12-20 NOTE — Anesthesia Postprocedure Evaluation (Signed)
Anesthesia Post Note  Patient: Alexis Arnold  Procedure(s) Performed: OPEN REDUCTION INTERNAL FIXATION (ORIF) RIGHT ANKLE FRACTURE (Right Ankle)     Patient location during evaluation: PACU Anesthesia Type: General Level of consciousness: awake and alert Pain management: pain level controlled Vital Signs Assessment: post-procedure vital signs reviewed and stable Respiratory status: spontaneous breathing, nonlabored ventilation, respiratory function stable and patient connected to nasal cannula oxygen Cardiovascular status: blood pressure returned to baseline and stable Postop Assessment: no apparent nausea or vomiting Anesthetic complications: no    Last Vitals:  Vitals:   12/20/17 1945 12/20/17 2017  BP: 122/72 (!) 142/72  Pulse: 72 68  Resp: 15 13  Temp: 36.6 C 36.5 C  SpO2:  100%    Last Pain:  Vitals:   12/20/17 2130  TempSrc:   PainSc: [place]

## 2017-12-21 ENCOUNTER — Observation Stay (HOSPITAL_BASED_OUTPATIENT_CLINIC_OR_DEPARTMENT_OTHER)

## 2017-12-21 DIAGNOSIS — M7989 Other specified soft tissue disorders: Secondary | ICD-10-CM

## 2017-12-21 DIAGNOSIS — S82851A Displaced trimalleolar fracture of right lower leg, initial encounter for closed fracture: Secondary | ICD-10-CM | POA: Diagnosis not present

## 2017-12-21 MED ORDER — HYDROCODONE-ACETAMINOPHEN 5-325 MG PO TABS: 1.0000 | ORAL_TABLET | ORAL | 0 refills | Status: DC | PRN

## 2017-12-21 NOTE — Discharge Instructions (Signed)
No weight at all on your right ankle. Keep your dressing clean and dry. Ice and elevation for swelling. Do take a 325 mg aspirin daily and do pump your ankles occasionally throughout the day. You can change your dressing if you want to in 5 days and actually get your incisions wet in the shower.

## 2017-12-21 NOTE — Discharge Summary (Signed)
Patient ID: Alexis Arnold MRN: [id number] DOB/AGE: 1960/11/28 57 y.o.  Admit date: 12/20/2017 Discharge date: 12/21/2017  Admission Diagnoses:  Principal Problem:   Closed right trimalleolar fracture, initial encounter Active Problems:   Closed displaced trimalleolar fracture of right ankle   Discharge Diagnoses:  Same  Past Medical History:  Diagnosis Date  . Complication of anesthesia 2001  . Endometrial mass   . History of adenomatous polyp of colon    tubular adenoma 10-31-2013  . History of blood transfusion   . History of DVT of lower extremity    right lower extremity 07-30-2008  . Hypothyroidism   . 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 PMB (postmenopausal bleeding)   . PONV (postoperative nausea and vomiting)    post op nausea, treated succedfully with Zofran  . Tubular adenoma of colon     Surgeries: Procedure(s): OPEN REDUCTION INTERNAL FIXATION (ORIF) RIGHT ANKLE FRACTURE on 12/20/2017   Consultants:   Discharged Condition: Improved  Hospital Course: Alexis Arnold is an 57 y.o. female who was admitted 12/20/2017 for operative treatment ofClosed right trimalleolar fracture, initial encounter. Patient has severe unremitting pain that affects sleep, daily activities, and work/hobbies. After pre-op clearance the patient was taken to the operating room on 12/20/2017 and underwent  Procedure(s): OPEN REDUCTION INTERNAL FIXATION (ORIF) RIGHT ANKLE FRACTURE.    Patient was given perioperative antibiotics:  Anti-infectives (From admission, onward)   Start     Dose/Rate Route Frequency Ordered Stop   12/20/17 2300  clindamycin (CLEOCIN) IVPB 600 mg     600 mg 100 mL/hr over 30 Minutes Intravenous Every 6 hours 12/20/17 2018 12/21/17 1659   12/20/17 1500  clindamycin (CLEOCIN) IVPB 900 mg  Status:  Discontinued     900 mg 100 mL/hr over 30 Minutes Intravenous On call to O.R. 12/20/17 1441 12/20/17 2002       Patient was given  sequential compression devices, early ambulation, and chemoprophylaxis to prevent DVT.  Patient benefited maximally from hospital stay and there were no complications.    Recent vital signs:  Patient Vitals for the past 24 hrs:  BP Temp Temp src Pulse Resp SpO2 Height Weight  12/21/17 0355 132/71 98 F (36.7 C) Oral 68 17 97 % - -  12/21/17 0025 (!) 145/77 98.4 F (36.9 C) Oral 74 17 98 % - -  12/20/17 2017 (!) 142/72 97.7 F (36.5 C) Oral 68 13 100 % - -  12/20/17 1945 122/72 97.9 F (36.6 C) - 72 15 - - -  12/20/17 1930 126/68 - - 93 18 90 % - -  12/20/17 1922 (!) 125/49 (!) 97.5 F (36.4 C) - (!) 105 13 94 % - -  12/20/17 1640 131/67 - - 79 11 98 % - -  12/20/17 1635 121/69 - - 77 11 98 % - -  12/20/17 1630 120/67 - - 75 12 96 % - -  12/20/17 1625 (!) 148/74 - - 79 14 100 % - -  12/20/17 1620 - - - 70 - 100 % - -  12/20/17 1459 137/84 99 F (37.2 C) Oral 67 18 100 % 5' 5.5" (1.664 m) 154 lb (69.9 kg)     Recent laboratory studies:  Recent Labs    12/20/17 1602 12/20/17 2041  WBC 8.3 7.0  HGB 10.0* 8.9*  HCT 32.8* 29.5*  PLT 461* 407*  CREATININE  --  0.86     Discharge Medications:   Allergies as of  12/21/2017      Reactions   Codeine Hives   Penicillins Other (See Comments)   Patient has anaphylactic reaction  to brethine(preservative) in injectables. Can take all others      Medication List    STOP taking these medications   acetaminophen 650 MG CR tablet Commonly known as:  TYLENOL 8 HOUR   ibuprofen 800 MG tablet Commonly known as:  ADVIL,MOTRIN   meloxicam 15 MG tablet Commonly known as:  MOBIC     TAKE these medications   cyanocobalamin 1000 MCG/ML injection Commonly known as:  (VITAMIN B-12) Inject 1,000 mcg into the muscle every 30 (thirty) days.   estrogens (conjugated) 1.25 MG tablet Commonly known as:  PREMARIN Take 1.25 mg by mouth at bedtime.   fluticasone 50 MCG/ACT nasal spray Commonly known as:  FLONASE Place 1 spray into both  nostrils daily as needed for allergies or rhinitis.   HYDROcodone-acetaminophen 5-325 MG tablet Commonly known as:  NORCO/VICODIN Take 1 tablet by mouth every 4 (four) hours as needed for moderate pain (pain score 4-6). What changed:    when to take this  reasons to take this   levothyroxine 200 MCG tablet Commonly known as:  SYNTHROID, LEVOTHROID Take 200 mcg by mouth at bedtime.   multivitamin tablet Take 1 tablet by mouth at bedtime.   progesterone 100 MG capsule Commonly known as:  PROMETRIUM Take 100 mg by mouth at bedtime.       Diagnostic Studies: Dg Ankle Complete Right  Result Date: 12/20/2017 CLINICAL DATA:  Right ankle fracture EXAM: RIGHT ANKLE - COMPLETE 3+ VIEW; DG C-ARM 61-120 MIN COMPARISON:  12/13/2017 FINDINGS: Seven low resolution intraoperative spot views of the right ankle. Total fluoroscopy time was 1 minutes 42 seconds. Surgical plate and multiple screw fixation of distal fibular fracture. Plate and multiple screw fixation of medial malleolar fracture. Posterior malleolar fracture noted. Anatomic alignment. IMPRESSION: Intraoperative fluoroscopic assistance provided during surgical fixation of right ankle fractures Electronically Signed   By: Donavan Foil M.D.   On: 12/20/2017 19:25   Dg Ankle Complete Right  Result Date: 12/13/2017 CLINICAL DATA:  57 y/o  F; post reduction. EXAM: RIGHT ANKLE - COMPLETE 3+ VIEW COMPARISON:  12/13/2017 right ankle radiographs. FINDINGS: Trimalleolar fracture of the ankle joint with improved displaced post reduction. Mild persistent displacement of the fractures as well as lateral subluxation of the ankle mortise. Fine bony details are obscured by the overlying cast. IMPRESSION: Acute trimalleolar fracture of the ankle joint with improved displaced post reduction. Mild persistent displacement of the fractures as well as lateral subluxation of the ankle mortise. Electronically Signed   By: Kristine Garbe M.D.   On:  12/13/2017 02:38   Dg Ankle Complete Right  Result Date: 12/13/2017 CLINICAL DATA:  57 y/o  F; fall with right ankle pain. EXAM: RIGHT ANKLE - COMPLETE 3+ VIEW COMPARISON:  08/28/2016 right ankle radiographs. FINDINGS: Acute 1 corner shaft's with displaced fracture of lateral malleolus extending to level of tibial plafond. Acute fracture of medial malleolus with mild lateral displacement. Acute minimally displaced fracture of posterior malleolus. Lateral subluxation of the ankle mortise. Talar dome is intact. No other fracture identified. Small dorsal and plantar calcaneal enthesophytes. IMPRESSION: Acute trimalleolar right ankle fracture. Lateral displacement of the medial and lateral malleolus fractures as well as lateral ankle mortise subluxation. Electronically Signed   By: Kristine Garbe M.D.   On: 12/13/2017 00:43   Dg C-arm 1-60 Min  Result Date: 12/20/2017 CLINICAL DATA:  Right ankle fracture EXAM: RIGHT ANKLE - COMPLETE 3+ VIEW; DG C-ARM 61-120 MIN COMPARISON:  12/13/2017 FINDINGS: Seven low resolution intraoperative spot views of the right ankle. Total fluoroscopy time was 1 minutes 42 seconds. Surgical plate and multiple screw fixation of distal fibular fracture. Plate and multiple screw fixation of medial malleolar fracture. Posterior malleolar fracture noted. Anatomic alignment. IMPRESSION: Intraoperative fluoroscopic assistance provided during surgical fixation of right ankle fractures Electronically Signed   By: Donavan Foil M.D.   On: 12/20/2017 19:25   Dg C-arm 1-60 Min  Result Date: 12/20/2017 CLINICAL DATA:  Right ankle fracture EXAM: RIGHT ANKLE - COMPLETE 3+ VIEW; DG C-ARM 61-120 MIN COMPARISON:  12/13/2017 FINDINGS: Seven low resolution intraoperative spot views of the right ankle. Total fluoroscopy time was 1 minutes 42 seconds. Surgical plate and multiple screw fixation of distal fibular fracture. Plate and multiple screw fixation of medial malleolar fracture. Posterior  malleolar fracture noted. Anatomic alignment. IMPRESSION: Intraoperative fluoroscopic assistance provided during surgical fixation of right ankle fractures Electronically Signed   By: Donavan Foil M.D.   On: 12/20/2017 19:25    Disposition: Discharge disposition: 01-Home or [place]    Mcarthur Rossetti, MD Follow up in 2 week(s).   Specialty:  Orthopedic Surgery Contact information: [place] [address] [phone]            Signed: Mcarthur Rossetti 12/21/2017, 11:54 AM

## 2017-12-21 NOTE — Progress Notes (Signed)
Patient ID: Alexis Arnold, female   DOB: April 15, 1961, 57 y.o.   MRN: [id number] Venous ultrasound negative for right LE DVT.  Doing well overall.  Can be discharged to home this afternoon.

## 2017-12-21 NOTE — Progress Notes (Signed)
Patient ID: Alexis Arnold, female   DOB: [birth year], 57 y.o.   MRN: [id number] Tolerating surgery well.  Not much pain at surgical site due to block still in effect.  She does report posterior knee pain and tightness.  She does have a history of DVT in the past.  Will need to obtain venous doppler study today prior to discharge to rule out a DVT in the right LE.  Can be up with therapy with non-weight bearing on her right ankle.

## 2017-12-21 NOTE — Evaluation (Signed)
Physical Therapy Evaluation Patient Details Name: Alexis Arnold MRN: [id number] DOB: 01/10/1961 Today's Date: 12/21/2017   History of Present Illness  Pt. is a 57 y.o. F who presents with right trimalleolar fx s/p ORIF.   Clinical Impression  Patient evaluated by Physical Therapy with no further acute PT needs identified. All education has been completed and the patient has no further questions. Patient states she is modified independent with all mobility using knee scooter and will seek OPPT once weightbearing status changes. Has all equipment needs at home. Session focused on patient request to receive right lower extremity strengthening exercises in order to maintain range of motion and functional strength. PT is signing off. Thank you for this referral.     Follow Up Recommendations No PT follow up((PT follow up after change in weightbearing status))    Equipment Recommendations  None recommended by PT    Recommendations for Other Services       Precautions / Restrictions Precautions Precautions: Fall Restrictions Weight Bearing Restrictions: Yes RLE Weight Bearing: Non weight bearing      Mobility  Bed Mobility      Patient seated EOB upon arrival.            Transfers          Deferred           Ambulation/Gait          Deferred      Stairs            Wheelchair Mobility    Modified Rankin (Stroke Patients Only)       Balance                                             Pertinent Vitals/Pain Pain Assessment: No/denies pain    Home Living Family/patient expects to be discharged to:: Private residence Living Arrangements: Spouse/significant other Available Help at Discharge: Other (Comment);Available PRN/intermittently(significant other) Type of Home: Apartment Home Access: Level entry     Home Layout: One level Home Equipment: Other (comment)(knee scooter)      Prior Function Level of Independence:  Independent         Comments: Works as a Haematologist        Extremity/Trunk Assessment   Upper Extremity Assessment Upper Extremity Assessment: Overall WFL for tasks assessed    Lower Extremity Assessment Lower Extremity Assessment: RLE deficits/detail;LLE deficits/detail RLE Deficits / Details: RLE NWB. At least anti gravity for hip flexion and knee extension/flexion LLE Deficits / Details: [facility] for tasks assessed       Communication   Communication: No difficulties  Cognition Arousal/Alertness: Awake/alert Behavior During Therapy: WFL for tasks assessed/performed Overall Cognitive Status: Within Functional Limits for tasks assessed                                        General Comments      Exercises Other Exercises Other Exercises: Ankle ABC exercise for range of motion (dorsiflexion, plantarflexion, inversion, eversion) Other Exercises: Towel toe curls for foot intrinsic strengthening Other Exercises: RLE LAQ's x 5 Other Exercises: seated hip flexion marching x 5 Other Exercises: Instructed patient on towel squeezes for hip adduction and isometric holds for hip abduction with theraband   Assessment/Plan  PT Assessment Patent does not need any further PT services  PT Problem List         PT Treatment Interventions      PT Goals (Current goals can be found in the Care Plan section)  Acute Rehab PT Goals Patient Stated Goal: return to work PT Goal Formulation: All assessment and education complete, DC therapy    Frequency     Barriers to discharge        Co-evaluation               AM-PAC PT "6 Clicks" Daily Activity  Outcome Measure                  End of Session   Activity Tolerance: Patient tolerated treatment well Patient left: in bed;with call bell/phone within reach Nurse Communication: Mobility status PT Visit Diagnosis: Difficulty in walking, not elsewhere classified  (R26.2)    Time: 1791-5056 PT Time Calculation (min) (ACUTE ONLY): 16 min   Charges:   PT Evaluation $PT Eval Low Complexity: 1 Low     PT G Codes:        Ellamae Sia, PT, DPT Acute Rehabilitation Services  Pager: [phone]   Willy Eddy 12/21/2017, 5:14 PM

## 2017-12-21 NOTE — Care Management Note (Signed)
Case Management Note  Patient Details  Name: Alexis Arnold MRN: [id number] Date of Birth: 1961-03-26  Subjective/Objective:  57 yr old female s/p ORIF of right ankle fracture.                  Action/Plan: Patient informed nurse to let CM know that she declines home health services. She has a knee scooter and support at discharge.     Expected Discharge Date:  12/21/17               Expected Discharge Plan:  Home/Self Care  In-House Referral:  NA  Discharge planning Services  CM Consult  Post Acute Care Choice:  NA Choice offered to:  Patient  DME Arranged:  N/A DME Agency:  NA  HH Arranged:  Patient Refused [place] Agency:  NA  Status of Service:  Completed, signed off  If discussed at [place] of Stay Meetings, dates discussed:    Additional Comments:  Ninfa Meeker, RN 12/21/2017, 2:17 PM

## 2017-12-21 NOTE — Progress Notes (Signed)
Pt given discharge instructions and gone over with her. Answered all questions to satisfaction. All belongings gathered to be sent home. Pt decline HHPT at this time, instructed to call Dr. Trevor Mace office if she changes her mind. Friend is picking her up and driving her home.

## 2017-12-21 NOTE — Progress Notes (Signed)
Right lower extremity venous duplex completed. Preliminary results  - There is no evidnece of DVT or superficial thrombosis In the segments able to be imaged. Unable to image the mid to distal pedal veins due to new surgical dressing on the actual dressing site. There is no evidence of a Baker's cyst. Toma Copier, RVS 12/21/2017 9:38 AM

## 2017-12-23 ENCOUNTER — Encounter (HOSPITAL_COMMUNITY): Payer: Self-pay | Admitting: Orthopaedic Surgery

## 2017-12-27 ENCOUNTER — Encounter (HOSPITAL_COMMUNITY): Payer: Self-pay | Admitting: Orthopaedic Surgery

## 2017-12-28 ENCOUNTER — Encounter (INDEPENDENT_AMBULATORY_CARE_PROVIDER_SITE_OTHER): Payer: Self-pay | Admitting: Orthopaedic Surgery

## 2017-12-28 ENCOUNTER — Ambulatory Visit (INDEPENDENT_AMBULATORY_CARE_PROVIDER_SITE_OTHER): Admitting: Orthopaedic Surgery

## 2017-12-28 ENCOUNTER — Ambulatory Visit (INDEPENDENT_AMBULATORY_CARE_PROVIDER_SITE_OTHER)

## 2017-12-28 DIAGNOSIS — Z967 Presence of other bone and tendon implants: Secondary | ICD-10-CM

## 2017-12-28 DIAGNOSIS — Z8781 Personal history of (healed) traumatic fracture: Secondary | ICD-10-CM

## 2017-12-28 DIAGNOSIS — Z9889 Other specified postprocedural states: Principal | ICD-10-CM

## 2017-12-28 MED ORDER — HYDROCODONE-ACETAMINOPHEN 5-325 MG PO TABS: 1.0000 | ORAL_TABLET | Freq: Four times a day (QID) | ORAL | 0 refills | Status: DC | PRN

## 2017-12-28 MED ORDER — SULFAMETHOXAZOLE-TRIMETHOPRIM 800-160 MG PO TABS: 1.0000 | ORAL_TABLET | Freq: Two times a day (BID) | ORAL | 0 refills | Status: DC

## 2017-12-28 NOTE — Progress Notes (Signed)
The patient is 8 days status post open reduction and fixation of a complex right ankle fracture.  We had to place significant hardware medially and laterally due to this being a vertical shear fracture of the medial side.  She also had a small trimalleolar component.  She has been adherent to being nonweightbearing on that ankle.  On exam her suture lines are intact but we need to keep the sutures in place for at least another week.  I am going to have her get it wet and shower daily and place Bactroban ointment on each incision after surgery.  Prophylactically I will also send in some Bactrim double strength.  I did refill her hydrocodone as well.  X-rays of the ankle show intact hardware and intact fracture reduction.  This is of the right ankle.  At this point we will see her back in a week for suture removal but no x-rays are needed.

## 2018-01-03 ENCOUNTER — Ambulatory Visit (INDEPENDENT_AMBULATORY_CARE_PROVIDER_SITE_OTHER): Admitting: Internal Medicine

## 2018-01-03 ENCOUNTER — Encounter: Payer: Self-pay | Admitting: Internal Medicine

## 2018-01-03 VITALS — BP 130/78 | HR 64 | Ht 60.5 in | Wt 153.2 lb

## 2018-01-03 DIAGNOSIS — D508 Other iron deficiency anemias: Secondary | ICD-10-CM

## 2018-01-03 DIAGNOSIS — K625 Hemorrhage of anus and rectum: Secondary | ICD-10-CM

## 2018-01-03 DIAGNOSIS — Z86010 Personal history of colonic polyps: Secondary | ICD-10-CM | POA: Diagnosis not present

## 2018-01-03 DIAGNOSIS — E538 Deficiency of other specified B group vitamins: Secondary | ICD-10-CM | POA: Diagnosis not present

## 2018-01-03 DIAGNOSIS — Z9884 Bariatric surgery status: Secondary | ICD-10-CM | POA: Diagnosis not present

## 2018-01-03 MED ORDER — SUPREP BOWEL PREP KIT 17.5-3.13-1.6 GM/177ML PO SOLN: 1.0000 | ORAL | 0 refills | Status: DC

## 2018-01-03 NOTE — Patient Instructions (Signed)
You have been scheduled for a colonoscopy. Please follow written instructions given to you at your visit today.  Please pick up your prep supplies at the pharmacy within the next 1-3 days. If you use inhalers (even only as needed), please bring them with you on the day of your procedure. Your physician has requested that you go to www.[email or web address].com and enter the access code given to you at your visit today. This web site gives a general overview about your procedure. However, you should still follow specific instructions given to you by our office regarding your preparation for the procedure.  If you are age 68 or older, your body mass index should be between 23-30. Your Body mass index is 29.44 kg/m. If this is out of the aforementioned range listed, please consider follow up with your Primary Care Provider.  If you are age 8 or younger, your body mass index should be between 19-25. Your Body mass index is 29.44 kg/m. If this is out of the aformentioned range listed, please consider follow up with your Primary Care Provider.

## 2018-01-03 NOTE — Progress Notes (Signed)
Patient ID: Alexis Arnold, female   DOB: May 25, 1961, 57 y.o.   MRN: [id number] HPI: Alexis Arnold is a 57 year old female with a history of adenomatous colon polyps, history of gastric bypass in 2004, remote DVT not on anticoagulation, history of iron deficiency anemia felt secondary to gastric bypass, B12 deficiency felt secondary to gastric bypass who seen in consultation at the request of Dr. Philip Aspen to evaluate rectal bleeding.  She is here alone today.  She had screening colonoscopy which I performed just over 4 years ago on 10/31/2013.  This revealed a 7 8 mm transverse polyp removed with snare cautery.  The remainder of the exam was normal including the terminal ileum.  This was a tubular adenoma.  5-year recall was recommended.    She reports that for 5 months ago she developed painless rectal bleeding.  This occurs roughly 1 time per day though she has 2-3 mostly loose stools daily.  This is been chronic since her gastric bypass.  No fissure symptom.  No perianal irritation or pain.  No change in her bowel habits.  No abdominal pain.  She denies upper GI and hepatobiliary complaint.  She has been receiving IV iron infusions as needed with primary care.  Her iron deficiency is felt secondary to her modified gastric bypass (states that her root limb extends to her ileum rather than just 100 cm/jejunum).  She also has a history of dysfunctional uterine bleeding.  She did recently suffer a fractured right ankle repair by Dr. Zollie Beckers.  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  Past Medical History:  Diagnosis Date  . Complication of anesthesia 2001  . Endometrial mass   . History of adenomatous polyp of colon    tubular adenoma 10-31-2013  . History of blood transfusion   . History of DVT of lower extremity    right lower extremity 07-30-2008  . Hypothyroidism   . 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 PMB  (postmenopausal bleeding)   . PONV (postoperative nausea and vomiting)    post op nausea, treated succedfully with Zofran  . Tubular adenoma of colon     Past Surgical History:  Procedure Laterality Date  . CATARACT EXTRACTION W/ INTRAOCULAR LENS  IMPLANT, BILATERAL  2016  . CESAREAN SECTION  1990  . COLONOSCOPY W/ POLYPECTOMY    . COMBINED REDUCTION MAMMAPLASTY W/ ABDOMINOPLASTY  2014   with panniculectomy  . HYSTEROSCOPY W/D&C N/A 11/21/2015   Procedure: DILATATION AND CURETTAGE /HYSTEROSCOPY ;  Surgeon: Marylynn Pearson, MD;  [facility];  Service: Gynecology;  Laterality: N/A;  . I&D EXTREMITY Right 03/08/2014   Procedure: MINOR IRRIGATION AND DEBRIDEMENT EXTREMITY;  Surgeon: Mcarthur Rossetti, MD;  Location: [facility];  Service: Orthopedics;  Laterality: Right;  . INCISION AND DRAINAGE Right    right arm cat bite.  . ORIF ANKLE FRACTURE Right 12/20/2017   Procedure: OPEN REDUCTION INTERNAL FIXATION (ORIF) RIGHT ANKLE FRACTURE;  Surgeon: Mcarthur Rossetti, MD;  Location: [facility];  Service: Orthopedics;  Laterality: Right;  . ROUX-EN-Y GASTRIC BYPASS  2004  . SHOULDER ARTHROSCOPY Right 2001    Outpatient Medications Prior to Visit  Medication Sig Dispense Refill  . cyanocobalamin (,VITAMIN B-12,) 1000 MCG/ML injection Inject 1,000 mcg into the muscle every 30 (thirty) days.     Marland Kitchen estrogens, conjugated, (PREMARIN) 1.25 MG tablet Take 1.25 mg by mouth at bedtime.     . fluticasone (FLONASE)  50 MCG/ACT nasal spray Place 1 spray into both nostrils daily as needed for allergies or rhinitis.    Marland Kitchen HYDROcodone-acetaminophen (NORCO/VICODIN) 5-325 MG tablet Take 1 tablet by mouth every 6 (six) hours as needed for moderate pain. 40 tablet 0  . levothyroxine (SYNTHROID, LEVOTHROID) 200 MCG tablet Take 200 mcg by mouth at bedtime.    . Multiple Vitamin (MULTIVITAMIN) tablet Take 1 tablet by mouth at bedtime.     . progesterone (PROMETRIUM) 100 MG capsule Take 100 mg  by mouth at bedtime.     . sulfamethoxazole-trimethoprim (BACTRIM DS,SEPTRA DS) 800-160 MG tablet Take 1 tablet by mouth 2 (two) times daily. 20 tablet 0   No facility-administered medications prior to visit.     Allergies  Allergen Reactions  . Codeine Hives  . Penicillins Other (See Comments)    Patient has anaphylactic reaction  to brethine(preservative) in injectables. Can take all others    Family History  Problem Relation Age of Onset  . Hypertension Brother   . Depression Mother   . Mental illness Mother   . Cancer Mother   . Colon cancer Neg Hx     Social History   Tobacco Use  . Smoking status: Former Smoker    Years: 14.00    Types: Cigarettes    Last attempt to quit: 11/17/1998    Years since quitting: 19.1  . Smokeless tobacco: Never Used  Substance Use Topics  . Alcohol use: Yes    Comment: occasionally 2-4 times per month  . Drug use: No    ROS: As per history of present illness, otherwise negative  BP 130/78 (BP Location: Left Arm, Patient Position: Sitting, Cuff Size: Normal)   Pulse 64   Ht 5' 0.5" (1.537 m)   Wt 153 lb 4 oz (69.5 kg)   LMP 03/05/2014   BMI 29.44 kg/m  Constitutional: Well-developed and well-nourished. No distress. HEENT: Normocephalic and atraumatic. Marland Kitchen Conjunctivae are normal.  No scleral icterus. Neurological: Alert and oriented to person place and time. Psychiatric: Normal mood and affect. Behavior is normal.  RELEVANT LABS AND IMAGING: CBC    Component Value Date/Time   WBC 7.0 12/20/2017 2041   RBC 3.39 (L) 12/20/2017 2041   HGB 8.9 (L) 12/20/2017 2041   HCT 29.5 (L) 12/20/2017 2041   PLT 407 (H) 12/20/2017 2041   MCV 87.0 12/20/2017 2041   MCH 26.3 12/20/2017 2041   MCHC 30.2 12/20/2017 2041   RDW 15.1 12/20/2017 2041   LYMPHSABS 2.4 03/07/2014 2226   MONOABS 1.0 03/07/2014 2226   EOSABS 0.1 03/07/2014 2226   BASOSABS 0.0 03/07/2014 2226    CMP     Component Value Date/Time   NA 138 03/08/2014 0500   K  3.6 (L) 03/08/2014 0500   CL 105 03/08/2014 0500   CO2 23 03/08/2014 0500   GLUCOSE 92 03/08/2014 0500   BUN 15 03/08/2014 0500   CREATININE 0.86 12/20/2017 2041   CALCIUM 8.4 03/08/2014 0500   GFRNONAA >60 12/20/2017 2041   GFRAA >60 12/20/2017 2041    ASSESSMENT/PLAN: 57 year old female with a history of adenomatous colon polyps, history of gastric bypass in 2004, remote DVT not on anticoagulation, history of iron deficiency anemia felt secondary to gastric bypass, B12 deficiency felt secondary to gastric bypass who seen in consultation at the request of Dr. Philip Aspen to evaluate rectal bleeding.  1.  Painless rectal bleeding/history of adenomatous colon polyp --given the rectal bleeding I have recommended that we repeat colonoscopy.  Symptoms  sound most consistent with internal hemorrhoids but rule out other pathology with colonoscopy.  If internal hemorrhoids are indeed found she would likely be a candidate for hemorrhoidal banding.  Brochure given today.  We discussed the risk, benefits and alternatives to colonoscopy and she is agreeable and wishes to proceed.  This will also suffice for her surveillance exam due next year.  2.  IDA/history of gastric bypass --she is getting IV iron periodically ordered by Dr. Philip Aspen.  I recommended that she also have a TTG for which she states she will ask Dr. Philip Aspen to order.  Continue IV iron as needed  3.  B12 deficiency --likely secondary to gastric bypass; continue IM B12 monthly with periodic monitoring of serum B12 level     TC:[id number], Quillian [address], [place], [place] [address]

## 2018-01-05 ENCOUNTER — Ambulatory Visit (INDEPENDENT_AMBULATORY_CARE_PROVIDER_SITE_OTHER): Admitting: Physician Assistant

## 2018-01-05 ENCOUNTER — Encounter (INDEPENDENT_AMBULATORY_CARE_PROVIDER_SITE_OTHER): Payer: Self-pay | Admitting: Physician Assistant

## 2018-01-05 DIAGNOSIS — S82851S Displaced trimalleolar fracture of right lower leg, sequela: Secondary | ICD-10-CM

## 2018-01-05 MED ORDER — DOXYCYCLINE HYCLATE 100 MG PO TABS: 100.0000 mg | ORAL_TABLET | Freq: Two times a day (BID) | ORAL | 0 refills | Status: AC

## 2018-01-05 NOTE — Progress Notes (Signed)
HPI: Alexis Arnold returns today 2 weeks 2 days status post ORIF of trimalleolar ankle fracture.  She is here mostly for wound check.  She has been on Bactrim and also applying Bactroban to the wound.  She had a low-grade fever.  Some redness about the wound.  She is just been washing the wounds with soap not particularly antibacterial.  Physical exam: Right ankle surgical incision she has some breakdown of the most proximal medial incision.  Remainder of both incisions with slight erythema.  No significant drainage.  No malodor.  Dorsal pedal pulses intact.  She is able to wiggle her toes.  Calf is supple with minimal tenderness.  Impression: 2 weeks 2 days status post right ankle open reduction internal fixation trimalleolar fracture.  Plan: Have her go on doxycycline 100 mg twice daily.  She will start soaking the wound and an antibacterial soap daily and drying it completely.  Keep the sutures intact see her back in 1 week for removal of the sutures.  She is nonweightbearing on the right lower extremity.  She is encouraged to elevate wiggle her toes often.

## 2018-01-10 ENCOUNTER — Ambulatory Visit
Admission: RE | Admit: 2018-01-10 | Discharge: 2018-01-10 | Disposition: A | Discharge status: Home / Self Care | Source: Ambulatory Visit | Attending: Physician Assistant | Admitting: Physician Assistant

## 2018-01-10 ENCOUNTER — Ambulatory Visit (INDEPENDENT_AMBULATORY_CARE_PROVIDER_SITE_OTHER): Admitting: Physician Assistant

## 2018-01-10 ENCOUNTER — Encounter (INDEPENDENT_AMBULATORY_CARE_PROVIDER_SITE_OTHER): Payer: Self-pay | Admitting: Physician Assistant

## 2018-01-10 ENCOUNTER — Other Ambulatory Visit (INDEPENDENT_AMBULATORY_CARE_PROVIDER_SITE_OTHER): Payer: Self-pay

## 2018-01-10 DIAGNOSIS — R0602 Shortness of breath: Secondary | ICD-10-CM

## 2018-01-10 DIAGNOSIS — S82851S Displaced trimalleolar fracture of right lower leg, sequela: Secondary | ICD-10-CM

## 2018-01-10 DIAGNOSIS — R071 Chest pain on breathing: Secondary | ICD-10-CM

## 2018-01-10 LAB — CBC WITH DIFFERENTIAL/PLATELET
BASOS ABS: 68 {cells}/uL (ref 0–200)
Basophils Relative: 1.3 %
EOS PCT: 5.6 %
Eosinophils Absolute: 291 cells/uL (ref 15–500)
HCT: 28.4 % — ABNORMAL LOW (ref 35.0–45.0)
Hemoglobin: 8.9 g/dL — ABNORMAL LOW (ref 11.7–15.5)
Lymphs Abs: 1596 cells/uL (ref 850–3900)
MCH: 25.1 pg — ABNORMAL LOW (ref 27.0–33.0)
MCHC: 31.3 g/dL — AB (ref 32.0–36.0)
MCV: 80.2 fL (ref 80.0–100.0)
MONOS PCT: 10.7 %
MPV: 10.1 fL (ref 7.5–12.5)
NEUTROS ABS: 2688 {cells}/uL (ref 1500–7800)
NEUTROS PCT: 51.7 %
Platelets: 511 10*3/uL — ABNORMAL HIGH (ref 140–400)
RBC: 3.54 10*6/uL — ABNORMAL LOW (ref 3.80–5.10)
RDW: 14.6 % (ref 11.0–15.0)
Total Lymphocyte: 30.7 %
WBC mixed population: 556 cells/uL (ref 200–950)
WBC: 5.2 10*3/uL (ref 3.8–10.8)

## 2018-01-10 LAB — TIQ-NTM

## 2018-01-10 MED ORDER — HYDROCODONE-ACETAMINOPHEN 5-325 MG PO TABS: 1.0000 | ORAL_TABLET | Freq: Four times a day (QID) | ORAL | 0 refills | Status: DC | PRN

## 2018-01-10 MED ORDER — IOPAMIDOL (ISOVUE-370) INJECTION 76%
75.0000 mL | Freq: Once | INTRAVENOUS | Status: AC | PRN
  Administered 2018-01-10: 75 mL via INTRAVENOUS

## 2018-01-10 NOTE — Progress Notes (Signed)
HPI: Alexis Arnold returns today follow-up for right ankle.  Said no fevers or chills.  She has had some shortness of breath.  She feels that this is due to her anemia.  She is due to have a colonoscopy due to some bleeding to determine if this is coming from her gastrointestinal tract or elsewhere.  She denies any dizziness loss of consciousness.  Physical exam: General: Well-developed well-nourished female in no acute distress mood and affect appropriate.   Right ankle sutures well approximated the lateral incision the medial incision the proximal portion with wound breakdown and area of ulceration.  No malodor.  No signs of gross infection.  Calf is supple minimal tenderness.  Impression: Status post open reduction internal fixation right ankle trimalleolar fracture 12/20/2017 Plan: We will obtain a CBC to check her H&H.  She develops any chest pain or tachycardia or worsening shortness of breath she will go to the ER.  I will call her with the results of her H&H.  Sutures were removed today Steri-Strips applied.  She will continue soaking the wounds and then antibacterial soap dry and well by Bactroban ointment.  See her back in 2 weeks check her right ankle wounds.  Also obtain 3 views of the right ankle at that time.

## 2018-01-13 ENCOUNTER — Other Ambulatory Visit: Payer: Self-pay

## 2018-01-13 ENCOUNTER — Ambulatory Visit (AMBULATORY_SURGERY_CENTER): Admitting: Internal Medicine

## 2018-01-13 ENCOUNTER — Encounter: Payer: Self-pay | Admitting: Internal Medicine

## 2018-01-13 VITALS — BP 144/78 | HR 58 | Temp 97.1°F | Resp 16 | Ht 60.0 in | Wt 153.0 lb

## 2018-01-13 DIAGNOSIS — D123 Benign neoplasm of transverse colon: Secondary | ICD-10-CM

## 2018-01-13 DIAGNOSIS — K625 Hemorrhage of anus and rectum: Secondary | ICD-10-CM | POA: Diagnosis present

## 2018-01-13 DIAGNOSIS — Z86010 Personal history of colonic polyps: Secondary | ICD-10-CM | POA: Diagnosis not present

## 2018-01-13 MED ORDER — SODIUM CHLORIDE 0.9 % IV SOLN: 500.0000 mL | Freq: Once | INTRAVENOUS | Status: AC

## 2018-01-13 NOTE — Progress Notes (Signed)
Called to room to assist during endoscopic procedure.  Patient ID and intended procedure confirmed with present staff. Received instructions for my participation in the procedure from the performing physician.  

## 2018-01-13 NOTE — Progress Notes (Signed)
Pt's states no medical or surgical changes since previsit or office visit. 

## 2018-01-13 NOTE — Patient Instructions (Signed)
Please read handouts on polyps ans hemorrhoids.     YOU HAD AN ENDOSCOPIC PROCEDURE TODAY AT [facility]:   Refer to the procedure report that was given to you for any specific questions about what was found during the examination.  If the procedure report does not answer your questions, please call your gastroenterologist to clarify.  If you requested that your care partner not be given the details of your procedure findings, then the procedure report has been included in a sealed envelope for you to review at your convenience later.  YOU SHOULD EXPECT: Some feelings of bloating in the abdomen. Passage of more gas than usual.  Walking can help get rid of the air that was put into your GI tract during the procedure and reduce the bloating. If you had a lower endoscopy (such as a colonoscopy or flexible sigmoidoscopy) you may notice spotting of blood in your stool or on the toilet paper. If you underwent a bowel prep for your procedure, you may not have a normal bowel movement for a few days.  Please Note:  You might notice some irritation and congestion in your nose or some drainage.  This is from the oxygen used during your procedure.  There is no need for concern and it should clear up in a day or so.  SYMPTOMS TO REPORT IMMEDIATELY:   Following lower endoscopy (colonoscopy or flexible sigmoidoscopy):  Excessive amounts of blood in the stool  Significant tenderness or worsening of abdominal pains  Swelling of the abdomen that is new, acute  Fever of 100F or higher   For urgent or emergent issues, a gastroenterologist can be reached at any hour by calling [phone].   DIET:  We do recommend a small meal at first, but then you may proceed to your regular diet.  Drink plenty of fluids but you should avoid alcoholic beverages for 24 hours.  ACTIVITY:  You should plan to take it easy for the rest of today and you should NOT DRIVE or use heavy machinery until tomorrow  (because of the sedation medicines used during the test).    FOLLOW UP: Our staff will call the number listed on your records the next business day following your procedure to check on you and address any questions or concerns that you may have regarding the information given to you following your procedure. If we do not reach you, we will leave a message.  However, if you are feeling well and you are not experiencing any problems, there is no need to return our call.  We will assume that you have returned to your regular daily activities without incident.  If any biopsies were taken you will be contacted by phone or by letter within the next 1-3 weeks.  Please call us at [phone] if you have not heard about the biopsies in 3 weeks.    SIGNATURES/CONFIDENTIALITY: You and/or your care partner have signed paperwork which will be entered into your electronic medical record.  These signatures attest to the fact that that the information above on your After Visit Summary has been reviewed and is understood.  Full responsibility of the confidentiality of this discharge information lies with you and/or your care-partner.

## 2018-01-13 NOTE — Op Note (Signed)
[facility] Patient Name: Alexis Arnold Procedure Date: 01/13/2018 4:12 PM MRN: [id number] Endoscopist: Jerene Bears , MD Age: 57 Referring MD:  Date of Birth: 1961-01-09 Gender: Female Account #: [id number] Procedure:                Colonoscopy Indications:              Last colonoscopy: April 2015, Rectal bleeding,                            Personal history of colonic polyps Medicines:                Monitored Anesthesia Care Procedure:                Pre-Anesthesia Assessment:                           - Prior to the procedure, a History and Physical                            was performed, and patient medications and                            allergies were reviewed. The patient's tolerance of                            previous anesthesia was also reviewed. The risks                            and benefits of the procedure and the sedation                            options and risks were discussed with the patient.                            All questions were answered, and informed consent                            was obtained. Prior Anticoagulants: The patient has                            taken no previous anticoagulant or antiplatelet                            agents. ASA Grade Assessment: II - A patient with                            mild systemic disease. After reviewing the risks                            and benefits, the patient was deemed in                            satisfactory condition to undergo the procedure.  After obtaining informed consent, the colonoscope                            was passed under direct vision. Throughout the                            procedure, the patient's blood pressure, pulse, and                            oxygen saturations were monitored continuously. The                            Model PCF-[id number]) scope was introduced                            through the anus and  advanced to the terminal                            ileum. The colonoscopy was performed without                            difficulty. The patient tolerated the procedure                            well. The quality of the bowel preparation was                            good. The terminal ileum, ileocecal valve,                            appendiceal orifice, and rectum were photographed. Scope In: 4:22:12 PM Scope Out: 4:39:37 PM Scope Withdrawal Time: 0 hours 12 minutes 10 seconds  Total Procedure Duration: 0 hours 17 minutes 25 seconds  Findings:                 The digital rectal exam was normal.                           Two sessile polyps were found in the transverse                            colon. The polyps were 1 to 2 mm in size. These                            polyps were removed with a cold biopsy forceps.                            Resection and retrieval were complete.                           Internal hemorrhoids were found during                            retroflexion. The hemorrhoids were small.  The exam was otherwise without abnormality. Complications:            No immediate complications. Estimated Blood Loss:     Estimated blood loss was minimal. Impression:               - Two 1 to 2 mm polyps in the transverse colon,                            removed with a cold biopsy forceps. Resected and                            retrieved.                           - Internal hemorrhoids.                           - The examination was otherwise normal. Recommendation:           - Patient has a contact number available for                            emergencies. The signs and symptoms of potential                            delayed complications were discussed with the                            patient. Return to normal activities tomorrow.                            Written discharge instructions were provided to the                             patient.                           - Resume previous diet.                           - Continue present medications.                           - Await pathology results.                           - Repeat colonoscopy in 5 years for surveillance.                           - Hemorrhoidal banding can be performed in the                            office if hemorrhoids remain symptomatic or are a                            recurrent problem. Jerene Bears, MD 01/13/2018 4:43:12 PM This report has been signed electronically.

## 2018-01-13 NOTE — Progress Notes (Signed)
To recovery, report to RN, VSS. 

## 2018-01-16 ENCOUNTER — Telehealth: Payer: Self-pay

## 2018-01-16 ENCOUNTER — Telehealth: Payer: Self-pay | Admitting: *Deleted

## 2018-01-16 NOTE — Telephone Encounter (Signed)
NO ANSWER, MESSAGE LEFT FOR PATIENT., second call

## 2018-01-16 NOTE — Telephone Encounter (Signed)
  Follow up Call-  Call back number 01/13/2018  Post procedure Call Back phone  # [phone]  Permission to leave phone message Yes  Some recent data might be hidden     Patient questions:  Message left to call us if necessary.

## 2018-01-23 ENCOUNTER — Encounter: Payer: Self-pay | Admitting: Internal Medicine

## 2018-01-25 ENCOUNTER — Ambulatory Visit (INDEPENDENT_AMBULATORY_CARE_PROVIDER_SITE_OTHER): Admitting: Orthopaedic Surgery

## 2018-01-25 ENCOUNTER — Ambulatory Visit (INDEPENDENT_AMBULATORY_CARE_PROVIDER_SITE_OTHER): Payer: Self-pay

## 2018-01-25 ENCOUNTER — Encounter (INDEPENDENT_AMBULATORY_CARE_PROVIDER_SITE_OTHER): Payer: Self-pay | Admitting: Orthopaedic Surgery

## 2018-01-25 DIAGNOSIS — S82851D Displaced trimalleolar fracture of right lower leg, subsequent encounter for closed fracture with routine healing: Secondary | ICD-10-CM | POA: Diagnosis not present

## 2018-01-25 MED ORDER — TRAMADOL HCL 50 MG PO TABS: 50.0000 mg | ORAL_TABLET | Freq: Four times a day (QID) | ORAL | 0 refills | Status: DC | PRN

## 2018-01-25 NOTE — Progress Notes (Signed)
Alexis Arnold is now 36 days status post open reduction and fixation of a complicated ankle fracture dislocation.  This is a trimalleolar fracture.  We will watch her closely with limited weightbearing and watching medial ankle wound.  She has been on antibiotics and working on wound care and doing well from that standpoint.  On exam she is putting slight weight on her ankle.  She has limited motion of the ankle but the swelling is gone down.  The medial ankle wound is still present but is looking better overall.  X-rays of the ankle also show good alignment of the fracture with no evidence of hardware failure or complicating features.  She will continue Bactroban ointment and clean the wound daily.  I will see her back in 4 weeks with repeat 3 views of her right ankle.  All questions concerns were answered and addressed.  I did send in some tramadol for her as well.

## 2018-02-27 ENCOUNTER — Ambulatory Visit (INDEPENDENT_AMBULATORY_CARE_PROVIDER_SITE_OTHER): Admitting: Orthopaedic Surgery

## 2018-02-27 ENCOUNTER — Encounter (INDEPENDENT_AMBULATORY_CARE_PROVIDER_SITE_OTHER): Payer: Self-pay | Admitting: Orthopaedic Surgery

## 2018-02-27 ENCOUNTER — Ambulatory Visit (INDEPENDENT_AMBULATORY_CARE_PROVIDER_SITE_OTHER)

## 2018-02-27 DIAGNOSIS — S82851D Displaced trimalleolar fracture of right lower leg, subsequent encounter for closed fracture with routine healing: Secondary | ICD-10-CM | POA: Diagnosis not present

## 2018-02-27 NOTE — Progress Notes (Signed)
Alexis Arnold is now 9 weeks status post open reduction and fixation of a complex right ankle trimalleolar fracture dislocation.  We have been concerned about a medial ankle wound that is now healing.  She was doing a lot of field training recently for 2 weeks and did use her cam walking boot and has worn that out.  She says she is doing well though overall.  She is only taking anti-inflammatories.  On exam she has improved right ankle range of motion.  Her incisions are healing nicely.  The wound on the medial side is almost healed over completely.  3 views of the right ankle are obtained and show intact hardware from fixation of a trimalleolar ankle fracture dislocation.  There is been interval healing.  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  At this point should continue increase her activities as comfort allows.  She can transition to just an ASO.  From this standpoint, we do not need to see her back in ~3 months from now with a final 3 views of her right ankle unless there is issues before then.

## 2018-05-31 ENCOUNTER — Ambulatory Visit (INDEPENDENT_AMBULATORY_CARE_PROVIDER_SITE_OTHER)

## 2018-05-31 ENCOUNTER — Ambulatory Visit (INDEPENDENT_AMBULATORY_CARE_PROVIDER_SITE_OTHER): Admitting: Orthopaedic Surgery

## 2018-05-31 ENCOUNTER — Encounter (INDEPENDENT_AMBULATORY_CARE_PROVIDER_SITE_OTHER): Payer: Self-pay | Admitting: Orthopaedic Surgery

## 2018-05-31 DIAGNOSIS — S8261XA Displaced fracture of lateral malleolus of right fibula, initial encounter for closed fracture: Secondary | ICD-10-CM

## 2018-05-31 NOTE — Progress Notes (Signed)
Alexis Arnold is now 5 months status post open reduction internal fixation of a complex trimalleolar ankle fracture of her right ankle.  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  She is going to enroll in a regular [employer] type of course/service to get her ready for that.  I agree given how well her ankle is doing.  She says her ankle shows some limited range of motion but overall she is doing well.  She has a history of osteopenia and is not a diabetic.  On exam she lacks full dorsiflexion by just a few degrees but overall the ankle looks great.  Both her medial lateral wounds have healed up nicely and she is neurovascularly intact.  3 views of the right ankle show intact hardware with no evidence of loosening.  Her joint space at the tibiotalar joint is well-maintained.  There is narrowing medially.  She had previous trauma to the ankle.  At this point she is released from my services.  All question concerns were answered and addressed.  She understands that she can bring me paperwork to fill out for her [employer] training and service if  needed.

## 2018-10-18 ENCOUNTER — Other Ambulatory Visit (HOSPITAL_COMMUNITY): Payer: Self-pay | Admitting: *Deleted

## 2018-10-19 ENCOUNTER — Encounter (HOSPITAL_COMMUNITY)
Admission: RE | Admit: 2018-10-19 | Discharge: 2018-10-19 | Disposition: A | Discharge status: Home / Self Care | Payer: Self-pay | Source: Ambulatory Visit | Attending: Internal Medicine | Admitting: Internal Medicine

## 2018-10-19 ENCOUNTER — Other Ambulatory Visit: Payer: Self-pay

## 2018-10-19 DIAGNOSIS — D649 Anemia, unspecified: Secondary | ICD-10-CM | POA: Insufficient documentation

## 2018-10-19 MED ORDER — SODIUM CHLORIDE 0.9 % IV SOLN
510.0000 mg | INTRAVENOUS | Status: DC
  Administered 2018-10-19: 510 mg via INTRAVENOUS
  Filled 2018-10-19: qty 510

## 2018-10-26 ENCOUNTER — Other Ambulatory Visit: Payer: Self-pay

## 2018-10-26 ENCOUNTER — Ambulatory Visit (HOSPITAL_COMMUNITY)
Admission: RE | Admit: 2018-10-26 | Discharge: 2018-10-26 | Disposition: A | Discharge status: Home / Self Care | Payer: Self-pay | Source: Ambulatory Visit | Attending: Internal Medicine | Admitting: Internal Medicine

## 2018-10-26 DIAGNOSIS — D649 Anemia, unspecified: Secondary | ICD-10-CM | POA: Insufficient documentation

## 2018-10-26 MED ORDER — SODIUM CHLORIDE 0.9 % IV SOLN
510.0000 mg | INTRAVENOUS | Status: DC
  Administered 2018-10-26: 510 mg via INTRAVENOUS
  Filled 2018-10-26: qty 510

## 2020-04-20 IMAGING — CR DG ANKLE COMPLETE 3+V*R*
3 series · 3 of 3 positions shown · non-contrast
Comparison: 08/28/2016 right ankle radiographs.

CLINICAL DATA: 56 y/o  F; fall with right ankle pain.

EXAM:
RIGHT ANKLE - COMPLETE 3+ VIEW

[x ankle ap right]
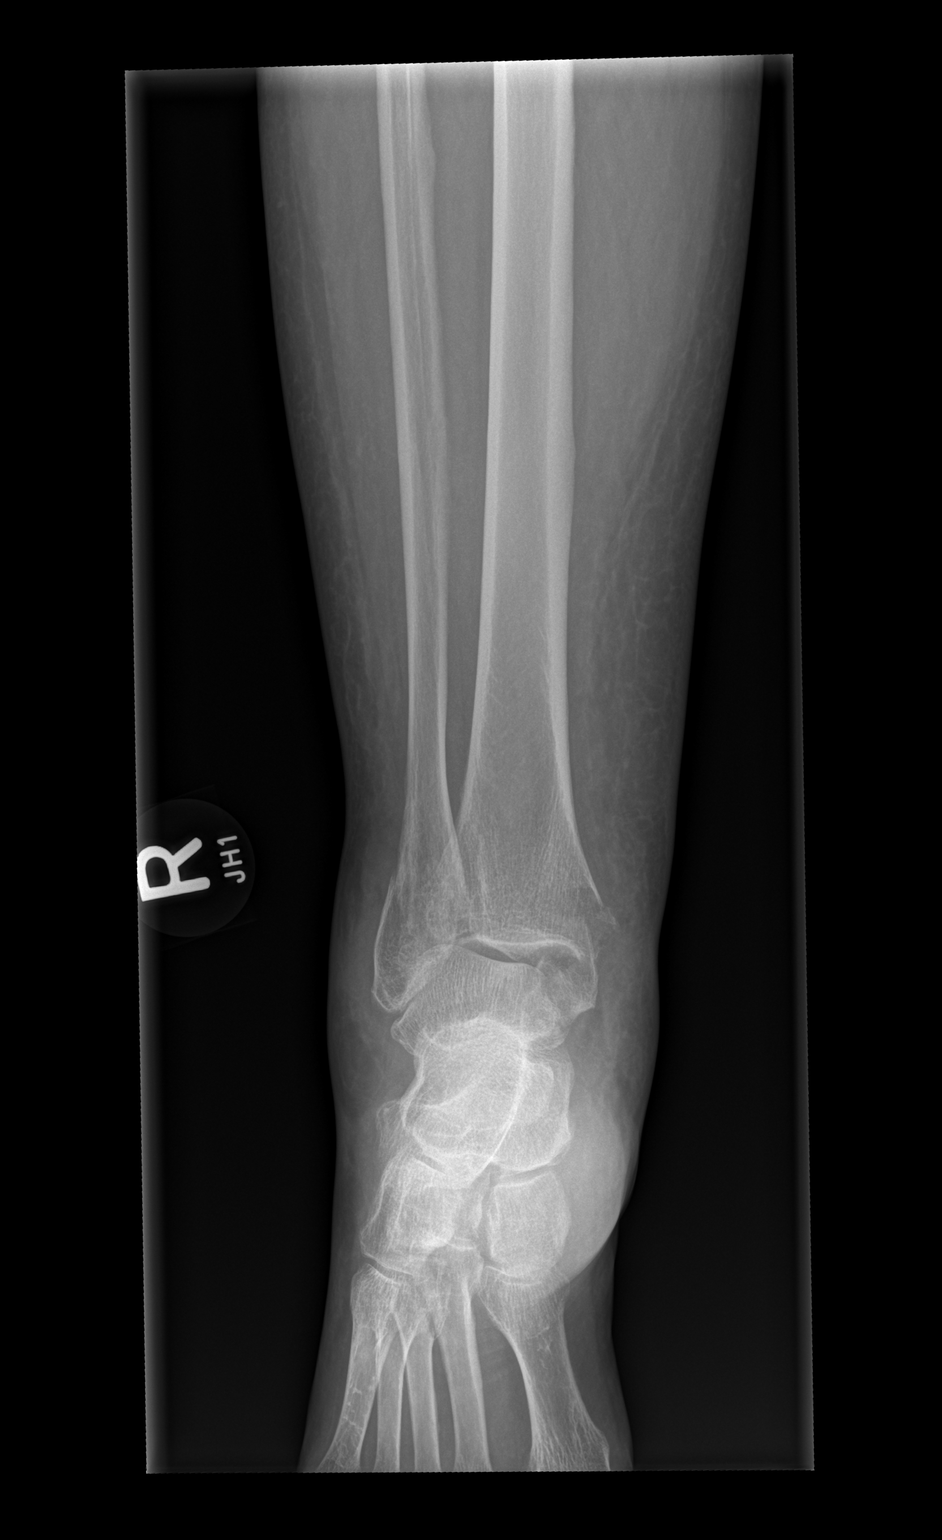

[x ankle obl right]
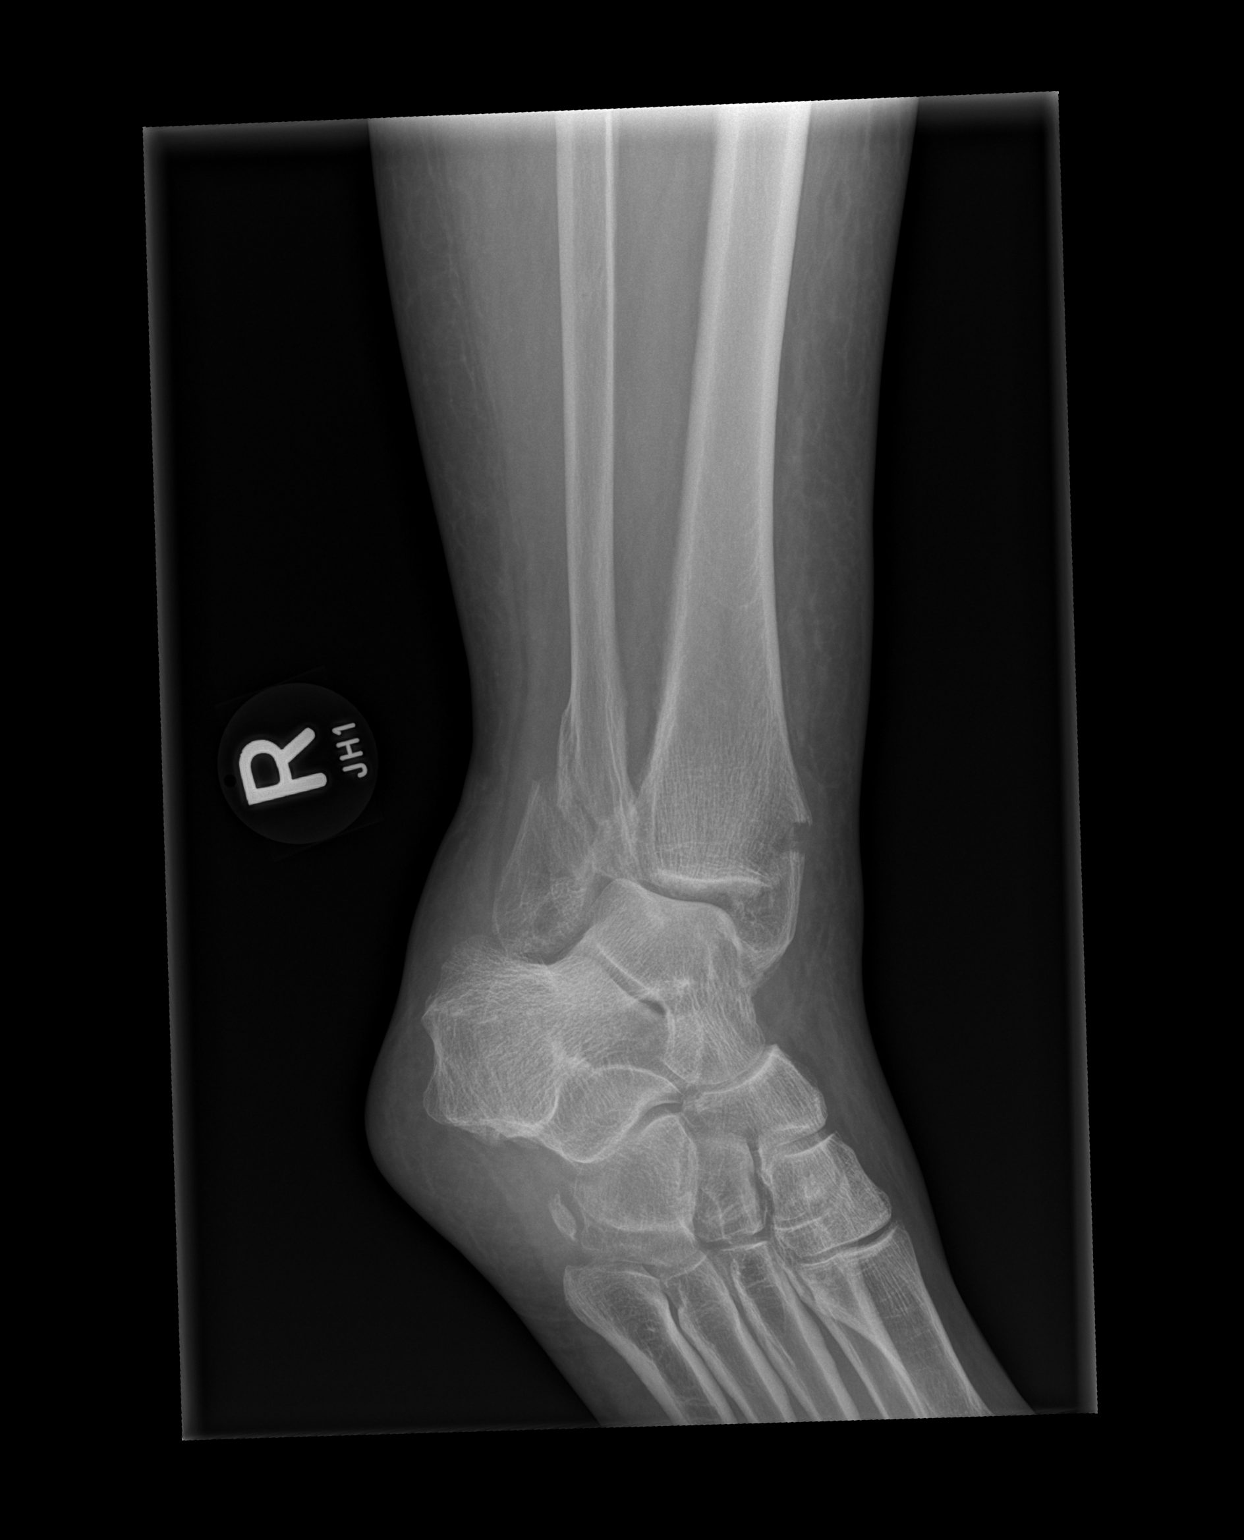

[x ankle lat right]
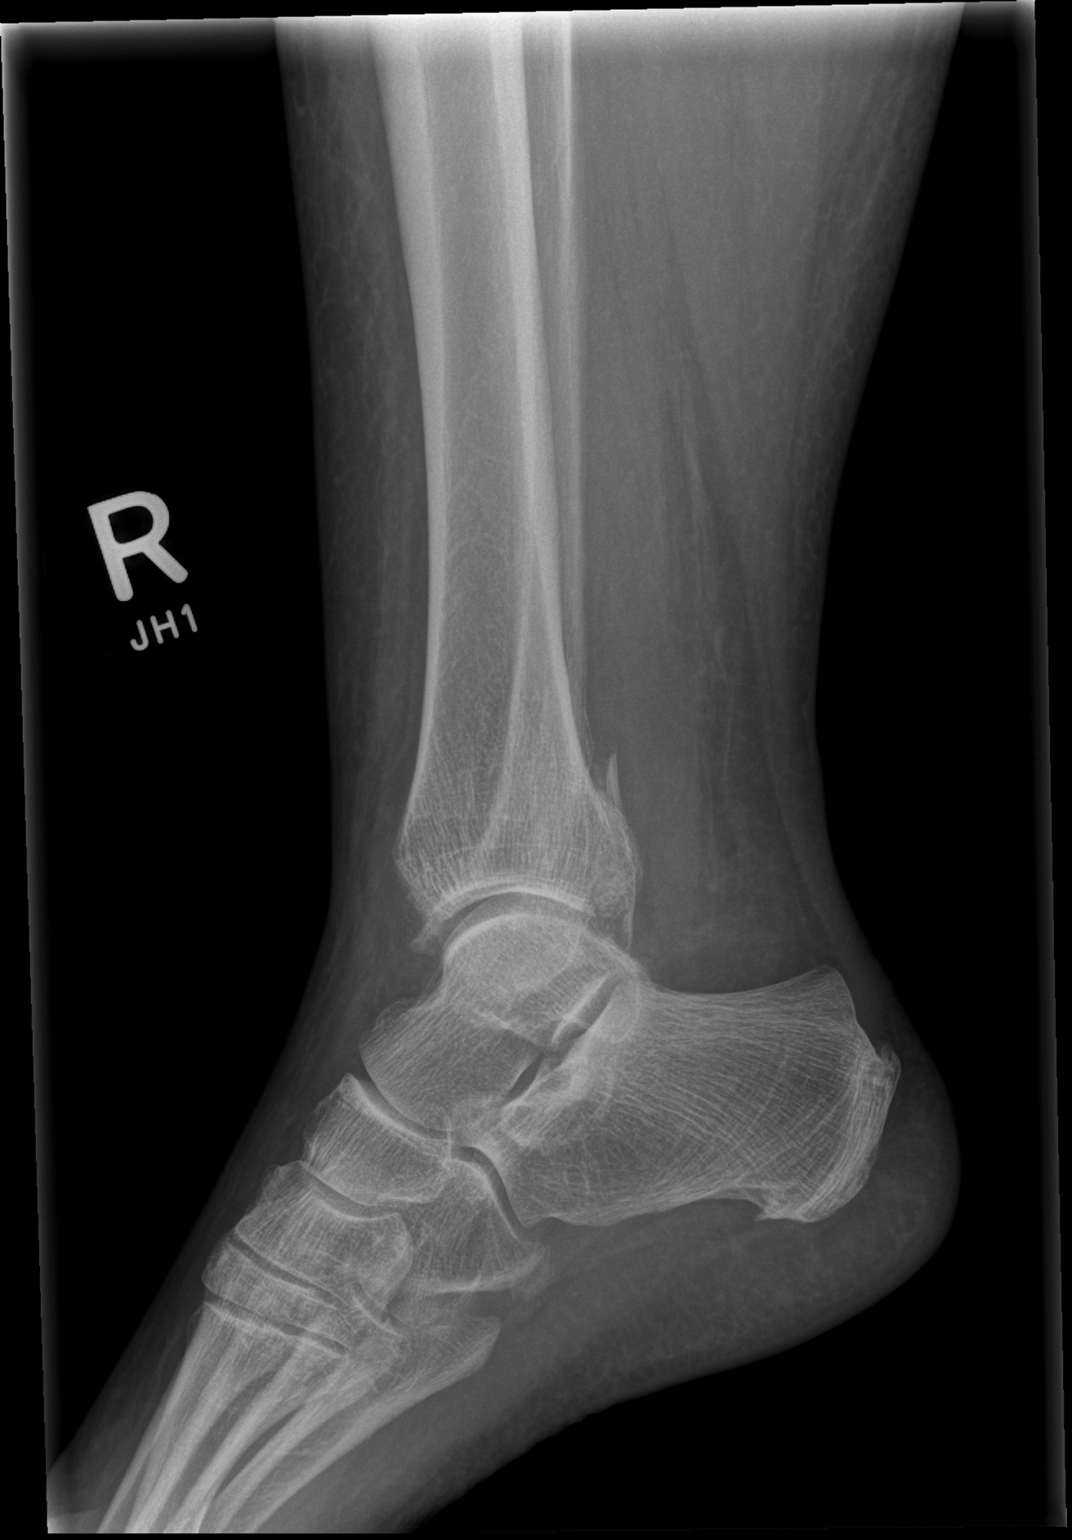

[3 of 3 positions shown; findings below may reference images not displayed]

FINDINGS: Acute 1 corner shaft's with displaced fracture of lateral malleolus
extending to level of tibial plafond. Acute fracture of medial
malleolus with mild lateral displacement. Acute minimally displaced
fracture of posterior malleolus. Lateral subluxation of the ankle
mortise. Talar dome is intact. No other fracture identified. Small
dorsal and plantar calcaneal enthesophytes.
IMPRESSION: Acute trimalleolar right ankle fracture. Lateral displacement of the
medial and lateral malleolus fractures as well as lateral ankle
mortise subluxation.

By: Alkeng Balo M.D.

## 2021-03-24 ENCOUNTER — Other Ambulatory Visit: Payer: Self-pay

## 2021-03-24 ENCOUNTER — Encounter (HOSPITAL_COMMUNITY): Payer: Self-pay | Admitting: Emergency Medicine

## 2021-03-24 ENCOUNTER — Emergency Department (HOSPITAL_COMMUNITY)
Admission: EM | Admit: 2021-03-24 | Discharge: 2021-03-24 | Disposition: A | Discharge status: Home / Self Care | Payer: Self-pay | Attending: Emergency Medicine | Admitting: Emergency Medicine

## 2021-03-24 DIAGNOSIS — R55 Syncope and collapse: Secondary | ICD-10-CM | POA: Insufficient documentation

## 2021-03-24 DIAGNOSIS — Z5321 Procedure and treatment not carried out due to patient leaving prior to being seen by health care provider: Secondary | ICD-10-CM | POA: Insufficient documentation

## 2021-03-24 DIAGNOSIS — R112 Nausea with vomiting, unspecified: Secondary | ICD-10-CM | POA: Insufficient documentation

## 2021-03-24 LAB — CBC WITH DIFFERENTIAL/PLATELET
Abs Immature Granulocytes: 0.02 10*3/uL (ref 0.00–0.07)
Basophils Absolute: 0.1 10*3/uL (ref 0.0–0.1)
Basophils Relative: 1 %
Eosinophils Absolute: 0.1 10*3/uL (ref 0.0–0.5)
Eosinophils Relative: 2 %
HCT: 40.9 % (ref 36.0–46.0)
Hemoglobin: 13.3 g/dL (ref 12.0–15.0)
Immature Granulocytes: 0 %
Lymphocytes Relative: 38 %
Lymphs Abs: 2.4 10*3/uL (ref 0.7–4.0)
MCH: 35.9 pg — ABNORMAL HIGH (ref 26.0–34.0)
MCHC: 32.5 g/dL (ref 30.0–36.0)
MCV: 110.5 fL — ABNORMAL HIGH (ref 80.0–100.0)
Monocytes Absolute: 0.5 10*3/uL (ref 0.1–1.0)
Monocytes Relative: 7 %
Neutro Abs: 3.2 10*3/uL (ref 1.7–7.7)
Neutrophils Relative %: 52 %
Platelets: 228 10*3/uL (ref 150–400)
RBC: 3.7 MIL/uL — ABNORMAL LOW (ref 3.87–5.11)
RDW: 14.2 % (ref 11.5–15.5)
WBC: 6.3 10*3/uL (ref 4.0–10.5)
nRBC: 0 % (ref 0.0–0.2)

## 2021-03-24 LAB — COMPREHENSIVE METABOLIC PANEL
ALT: 56 U/L — ABNORMAL HIGH (ref 0–44)
AST: 98 U/L — ABNORMAL HIGH (ref 15–41)
Albumin: 3.8 g/dL (ref 3.5–5.0)
Alkaline Phosphatase: 98 U/L (ref 38–126)
Anion gap: 9 (ref 5–15)
BUN: 12 mg/dL (ref 6–20)
CO2: 26 mmol/L (ref 22–32)
Calcium: 8.6 mg/dL — ABNORMAL LOW (ref 8.9–10.3)
Chloride: 103 mmol/L (ref 98–111)
Creatinine, Ser: 0.71 mg/dL (ref 0.44–1.00)
GFR, Estimated: >60 mL/min (ref >60)
Glucose, Bld: 80 mg/dL (ref 70–99)
Potassium: 3.8 mmol/L (ref 3.5–5.1)
Sodium: 138 mmol/L (ref 135–145)
Total Bilirubin: 0.8 mg/dL (ref 0.3–1.2)
Total Protein: 7.1 g/dL (ref 6.5–8.1)

## 2021-03-24 LAB — CBG MONITORING, ED: Glucose-Capillary: 70 mg/dL (ref 70–99)

## 2021-03-24 MED ORDER — ONDANSETRON 4 MG PO TBDP
4.0000 mg | ORAL_TABLET | Freq: Once | ORAL | Status: AC
  Administered 2021-03-24: 4 mg via ORAL
  Filled 2021-03-24: qty 1

## 2021-03-24 NOTE — ED Triage Notes (Addendum)
Arrives via EMS from  Pt was taking ACLS, stated she felt nauseated, had one episode of emesis, felt her heart racing and was near syncope. Stated she felt some Covid symptoms yesterday, rapid tested herself at home and was negative.

## 2021-03-24 NOTE — ED Triage Notes (Signed)
Per EMS- patient was at ACLS class felt nauseated and when she went to the bathroom she did vomit.and then had a near syncopal episode. EKG-nl.

## 2021-09-15 ENCOUNTER — Other Ambulatory Visit (HOSPITAL_COMMUNITY): Payer: Self-pay

## 2021-09-15 MED ORDER — AQUASOL A 50000 UNIT/ML IM SOLN
INTRAMUSCULAR | 0 refills | Status: AC
  Filled 2021-09-15: qty 20, 10d supply, fill #0
  Filled 2021-10-07: qty 20, 17d supply, fill #0

## 2021-09-15 MED ORDER — AQUASOL A 50000 UNIT/ML IM SOLN
INTRAMUSCULAR | 0 refills | Status: DC
  Filled 2021-09-15: qty 14, 17d supply, fill #0

## 2021-09-16 ENCOUNTER — Other Ambulatory Visit (HOSPITAL_COMMUNITY): Payer: Self-pay

## 2021-09-16 MED ORDER — "BD LUER-LOK SYRINGE 25G X 1"" 3 ML MISC": 0 refills | Status: DC

## 2021-09-25 ENCOUNTER — Other Ambulatory Visit (HOSPITAL_COMMUNITY): Payer: Self-pay

## 2021-10-07 ENCOUNTER — Other Ambulatory Visit (HOSPITAL_COMMUNITY): Payer: Self-pay

## 2022-04-22 ENCOUNTER — Encounter: Payer: Self-pay | Admitting: Internal Medicine

## 2022-05-19 ENCOUNTER — Encounter: Payer: Self-pay | Admitting: Nurse Practitioner

## 2022-05-19 ENCOUNTER — Ambulatory Visit: Admitting: Nurse Practitioner

## 2022-05-19 VITALS — BP 121/64 | HR 73 | Ht 66.0 in | Wt 134.0 lb

## 2022-05-19 DIAGNOSIS — R6881 Early satiety: Secondary | ICD-10-CM | POA: Diagnosis not present

## 2022-05-19 DIAGNOSIS — R110 Nausea: Secondary | ICD-10-CM

## 2022-05-19 DIAGNOSIS — K648 Other hemorrhoids: Secondary | ICD-10-CM

## 2022-05-19 DIAGNOSIS — K625 Hemorrhage of anus and rectum: Secondary | ICD-10-CM | POA: Diagnosis not present

## 2022-05-19 NOTE — Progress Notes (Signed)
Chief Complaint: Rectal bleeding, occasional lower abdominal discomfort , early satiety, nausea   Assessment & Plan   # 61 year old female with recurrent, intermittent painless rectal bleeding. Also with intermittent RLQ cramping unrelieved with defecation.  Last colonoscopy June 2019 with findings of small internal hemorrhoids and 2 tiny polyps.  She has been straining during defecation.  She has internal hemorrhoids on exam/anoscopy today which I suspect are the source of rectal bleeding.  Toilet positioning discussed to help facilitate evacuation of stool.  Glycerin suppositories as needed Based on exam she may be developing pelvic floor weakness/dysfunction.  If above measures not helpful then consider pelvic floor physical therapy. She has Preparation H suppositories at home.  I encouraged her to use this twice daily for the next 7 days.  She will let me know if rectal bleeding persist and I will arrange for her to see Dr. Hilarie Fredrickson for internal hemorrhoid banding If RLQ abdominal cramping persist or certainly if it gets worse then consider abdominal / pelvic imaging.  She has an upcoming GYN appointment  # Early satiety, intermittent nausea.  Her weight has been fluctuating, no drastic loss.  Etiology unclear.  We discussed upper endoscopy, she wants to hold off for now.  She would like to make some dietary changes first and see if that helps.  She will let me know if symptoms do not improve   #History of colon polyps. Two small adenomas removed in 2019.  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  See PMH / [place] for additional history   Alexis Arnold is her for rectal bleeding which started a couple of months back. Seen for the same in 2019. The  bleeding is painless. If occurs with and without BMs. Blood is bright red. Her stool is usually soft which she attributes to history of gastric bypass.  Recently she has had problems with constipation.  Though her stools remain soft she has had some difficulty expelling stool from her rectum   She has hypothyroidism, had normal TSH in July  2023  In addition to the constipation she has developed occasional mild lower abdominal cramping ( generally RLQ). Discomfort not relieved with defecation.. She also reports early satiety intermittent nausea without vomiting. Her weight has been fluctuating.   Previous Labs / Imaging::    Latest Ref Rng & Units 03/24/2021   12:28 PM 01/10/2018    9:41 AM 12/20/2017    8:41 PM  CBC  WBC 4.0 - 10.5 K/uL 6.3  5.2  7.0   Hemoglobin 12.0 - 15.0 g/dL 13.3  8.9  8.9   Hematocrit 36.0 - 46.0 % 40.9  28.4  29.5   Platelets 150 - 400 K/uL 228  511  407     Previous GI Evaluation   June 2019 colonoscopy for rectal bleeding and history of colon polyps - 2 sessile polyps ( 1-2 mm) removed. Small internal hemorrhoids.   Surgical [P], transverse, polyp (2) - TUBULAR ADENOMA. - BENIGN COLONIC MUCOSA. 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   PMB (postmenopausal bleeding)    PONV (postoperative nausea and vomiting)    post op nausea, treated succedfully with Zofran   Tubular adenoma of colon    Past Surgical History:  Procedure Laterality Date   CATARACT EXTRACTION W/ INTRAOCULAR LENS  IMPLANT, BILATERAL  2016   CESAREAN SECTION  1990   COLONOSCOPY W/ POLYPECTOMY     COMBINED REDUCTION MAMMAPLASTY W/  ABDOMINOPLASTY  2014   with panniculectomy   HYSTEROSCOPY WITH D & C N/A 11/21/2015   Procedure: DILATATION AND CURETTAGE /HYSTEROSCOPY ;  Surgeon: Marylynn Pearson, MD;  Location: [place];  Service: Gynecology;  Laterality: N/A;   I & D EXTREMITY Right 03/08/2014   Procedure: MINOR IRRIGATION AND DEBRIDEMENT EXTREMITY;  Surgeon: Mcarthur Rossetti, MD;  Location: [facility];  Service: Orthopedics;  Laterality: Right;   INCISION AND DRAINAGE Right    right arm cat bite.   ORIF ANKLE FRACTURE Right 12/20/2017   Procedure: OPEN REDUCTION INTERNAL FIXATION (ORIF) RIGHT ANKLE FRACTURE;  Surgeon: Mcarthur Rossetti, MD;  Location: [facility] [place] [facility];  Service: Orthopedics;  Laterality: Right;   ROUX-EN-Y GASTRIC BYPASS  2004   SHOULDER ARTHROSCOPY Right 2001   Family History  Problem Relation Age of Onset   Colon polyps Mother    Depression Mother    Mental illness Mother    Cancer Mother    Hypertension Brother    Kidney disease Paternal Grandmother    Colon cancer Neg Hx    Social History   Tobacco Use   Smoking status: Former    Years: 14.00    Types: Cigarettes    Quit date: 11/17/1998    Years since quitting: 23.5   Smokeless tobacco: Never  Vaping Use   Vaping Use: Never used  Substance Use Topics   Alcohol use: Yes    Comment: occasionally 2-4 times per month   Drug use: No   Current Outpatient Medications  Medication Sig Dispense Refill   fluticasone (FLONASE) 50 MCG/ACT nasal spray Place 1 spray into both nostrils daily as needed for allergies or rhinitis.     levothyroxine (SYNTHROID) 150 MCG tablet Take 150 mcg by mouth daily before breakfast.     Multiple Vitamin (MULTIVITAMIN) tablet Take 1 tablet by mouth at bedtime.      vitamin A (AQUASOL A) 50000 units/mL injection Inject 2 ml into the muscle daily for 3 days. 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 Can take all others     Review of Systems: Positive for vision changes, fatigue.  All other systems reviewed and negative except where noted in HPI.   Wt Readings from Last 3 Encounters:  05/19/22 134 lb (60.8 kg)  03/24/21 131 lb (59.4 kg)  10/26/18 145 lb (65.8 kg)    Physical Exam   BP 121/64   Pulse 73   Ht '5\' 6"'$  (1.676 m)  Wt 134 lb (60.8 kg)   LMP 03/05/2014   SpO2 98%   BMI 21.63 kg/m  Constitutional:  Generally well appearing female in no acute distress. Psychiatric: Pleasant. Normal mood and affect. Behavior is normal. EENT: Pupils normal.  Conjunctivae are normal. No scleral icterus. Neck supple.  Cardiovascular: Normal rate, regular rhythm. No edema Pulmonary/chest: Effort normal and breath sounds normal. No wheezing, rales or rhonchi. Abdominal: Soft, nondistended, nontender. Bowel sounds active throughout. There are no masses palpable. No hepatomegaly. Rectal : Perianally there are a few small fleshy hemorrhoidal tags.  On anoscopy there was soft light brown stool in vault.  Inflamed internal hemorrhoids found with scant bleeding.  Decreased pelvic descent with simulation of bowel movement Neurological: Alert and oriented to person place and time. Skin: Small bruises to distal ends of both lower extremities.  Bruise to right upper extremity near wrist.   Tye Savoy, NP  05/19/2022, 2:20 PM

## 2022-05-19 NOTE — Patient Instructions (Signed)
Can try glycerin suppositories as needed to help facilitate passage of stool.   Try Prep H suppositories twice daily for 7 days. If bleeding persists then call office and we can arrange for internal hemorrhoid banding with Dr. Hilarie Fredrickson   If the early satiety and nausea do not improve, or you continue to lose weight unintentionally then we can arrange for an EGD as discussed in clinic today

## 2022-05-20 NOTE — Progress Notes (Signed)
Addendum: Reviewed and agree with assessment and management plan. Jayvien Rowlette M, MD  

## 2023-01-04 ENCOUNTER — Encounter: Payer: Self-pay | Admitting: Internal Medicine
